# Patient Record
Sex: Male | Born: 1950 | Race: Black or African American | Marital: Single | Smoking: Never smoker
Health system: Southern US, Community
[De-identification: ages and names within clinical notes are randomized; demographics above are authoritative.]

## PROBLEM LIST (undated history)

## (undated) DIAGNOSIS — I10 Essential (primary) hypertension: Secondary | ICD-10-CM

---

## 2012-07-23 ENCOUNTER — Encounter (HOSPITAL_COMMUNITY): Payer: Self-pay | Admitting: *Deleted

## 2012-07-23 ENCOUNTER — Emergency Department (HOSPITAL_COMMUNITY): Payer: BC Managed Care – PPO

## 2012-07-23 ENCOUNTER — Emergency Department (HOSPITAL_COMMUNITY)
Admission: EM | Admit: 2012-07-23 | Discharge: 2012-07-23 | Disposition: A | Discharge status: Home / Self Care | Payer: BC Managed Care – PPO | Attending: Emergency Medicine | Admitting: Emergency Medicine

## 2012-07-23 DIAGNOSIS — R5381 Other malaise: Secondary | ICD-10-CM | POA: Insufficient documentation

## 2012-07-23 DIAGNOSIS — Z792 Long term (current) use of antibiotics: Secondary | ICD-10-CM | POA: Insufficient documentation

## 2012-07-23 DIAGNOSIS — J189 Pneumonia, unspecified organism: Secondary | ICD-10-CM

## 2012-07-23 DIAGNOSIS — R059 Cough, unspecified: Secondary | ICD-10-CM | POA: Insufficient documentation

## 2012-07-23 DIAGNOSIS — R509 Fever, unspecified: Secondary | ICD-10-CM | POA: Insufficient documentation

## 2012-07-23 DIAGNOSIS — I10 Essential (primary) hypertension: Secondary | ICD-10-CM | POA: Insufficient documentation

## 2012-07-23 DIAGNOSIS — R071 Chest pain on breathing: Secondary | ICD-10-CM | POA: Insufficient documentation

## 2012-07-23 DIAGNOSIS — R110 Nausea: Secondary | ICD-10-CM | POA: Insufficient documentation

## 2012-07-23 DIAGNOSIS — R05 Cough: Secondary | ICD-10-CM | POA: Insufficient documentation

## 2012-07-23 HISTORY — DX: Essential (primary) hypertension: I10

## 2012-07-23 LAB — CBC WITH DIFFERENTIAL/PLATELET
Basophils Absolute: 0 10*3/uL (ref 0.0–0.1)
Basophils Relative: 0 % (ref 0–1)
Eosinophils Absolute: 0 10*3/uL (ref 0.0–0.7)
Eosinophils Relative: 0 % (ref 0–5)
HCT: 44 % (ref 39.0–52.0)
Hemoglobin: 15.3 g/dL (ref 13.0–17.0)
Lymphocytes Relative: 10 % — ABNORMAL LOW (ref 12–46)
Lymphs Abs: 1.1 10*3/uL (ref 0.7–4.0)
MCH: 30.4 pg (ref 26.0–34.0)
MCHC: 34.8 g/dL (ref 30.0–36.0)
MCV: 87.3 fL (ref 78.0–100.0)
Monocytes Absolute: 1.6 10*3/uL — ABNORMAL HIGH (ref 0.1–1.0)
Monocytes Relative: 14 % — ABNORMAL HIGH (ref 3–12)
Neutro Abs: 8.7 10*3/uL — ABNORMAL HIGH (ref 1.7–7.7)
Neutrophils Relative %: 76 % (ref 43–77)
Platelets: 219 10*3/uL (ref 150–400)
RBC: 5.04 MIL/uL (ref 4.22–5.81)
RDW: 13.5 % (ref 11.5–15.5)
WBC: 11.5 10*3/uL — ABNORMAL HIGH (ref 4.0–10.5)

## 2012-07-23 LAB — COMPREHENSIVE METABOLIC PANEL
ALT: 19 U/L (ref 0–53)
AST: 26 U/L (ref 0–37)
Albumin: 3.6 g/dL (ref 3.5–5.2)
Alkaline Phosphatase: 64 U/L (ref 39–117)
BUN: 10 mg/dL (ref 6–23)
CO2: 27 mEq/L (ref 19–32)
Calcium: 8.8 mg/dL (ref 8.4–10.5)
Chloride: 99 mEq/L (ref 96–112)
Creatinine, Ser: 1.13 mg/dL (ref 0.50–1.35)
GFR calc Af Amer: 80 mL/min — ABNORMAL LOW (ref >90)
GFR calc non Af Amer: 69 mL/min — ABNORMAL LOW (ref >90)
Glucose, Bld: 113 mg/dL — ABNORMAL HIGH (ref 70–99)
Potassium: 3.3 mEq/L — ABNORMAL LOW (ref 3.5–5.1)
Sodium: 137 mEq/L (ref 135–145)
Total Bilirubin: 0.4 mg/dL (ref 0.3–1.2)
Total Protein: 7.5 g/dL (ref 6.0–8.3)

## 2012-07-23 MED ORDER — ACETAMINOPHEN 325 MG PO TABS
650.0000 mg | ORAL_TABLET | Freq: Once | ORAL | Status: AC
  Administered 2012-07-23: 650 mg via ORAL
  Filled 2012-07-23: qty 2

## 2012-07-23 MED ORDER — AZITHROMYCIN 250 MG PO TABS: 250.0000 mg | ORAL_TABLET | Freq: Every day | ORAL | Status: DC

## 2012-07-23 MED ORDER — IBUPROFEN 400 MG PO TABS
600.0000 mg | ORAL_TABLET | Freq: Once | ORAL | Status: DC
  Filled 2012-07-23: qty 1

## 2012-07-23 MED ORDER — DEXTROSE 5 % IV SOLN
500.0000 mg | Freq: Once | INTRAVENOUS | Status: AC
  Administered 2012-07-23: 500 mg via INTRAVENOUS
  Filled 2012-07-23: qty 500

## 2012-07-23 MED ORDER — POTASSIUM CHLORIDE CRYS ER 20 MEQ PO TBCR
20.0000 meq | EXTENDED_RELEASE_TABLET | Freq: Once | ORAL | Status: AC
  Administered 2012-07-23: 20 meq via ORAL
  Filled 2012-07-23: qty 1

## 2012-07-23 MED ORDER — DEXTROSE 5 % IV SOLN
1.0000 g | Freq: Once | INTRAVENOUS | Status: AC
  Administered 2012-07-23: 1 g via INTRAVENOUS
  Filled 2012-07-23: qty 10

## 2012-07-23 MED ORDER — IBUPROFEN 800 MG PO TABS
800.0000 mg | ORAL_TABLET | Freq: Once | ORAL | Status: AC
  Administered 2012-07-23: 800 mg via ORAL

## 2012-07-23 MED ORDER — SODIUM CHLORIDE 0.9 % IV BOLUS (SEPSIS)
1000.0000 mL | Freq: Once | INTRAVENOUS | Status: AC
  Administered 2012-07-23: 1000 mL via INTRAVENOUS

## 2012-07-23 NOTE — ED Provider Notes (Signed)
History    60 year old male with fevers and chills. Symptom onset approximately 3 days ago. Started out as generalized weakness and general malaise. Occasional cough which has become productive for the past 24 hours. Occasional sharp pleuritic CP. No shortness of breath. No unusual leg pain or swelling. Denies history of blood clot. Mild nausea but no vomiting.  Nonsmoker. Denies cardiac hx.   CSN: [id number]  Arrival date & time 07/23/12  1529   First MD Initiated Contact with Patient 07/23/12 2106      Chief Complaint  Patient presents with  . Chills    (Consider location/radiation/quality/duration/timing/severity/associated sxs/prior treatment) HPI  Past Medical History  Diagnosis Date  . Hypertension     History reviewed. No pertinent past surgical history.  History reviewed. No pertinent family history.  History  Substance Use Topics  . Smoking status: Never Smoker   . Smokeless tobacco: Not on file  . Alcohol Use: No      Review of Systems   Review of symptoms negative unless otherwise noted in HPI.   Allergies  Review of patient's allergies indicates no known allergies.  Home Medications   Current Outpatient Rx  Name  Route  Sig  Dispense  Refill  . AZITHROMYCIN 250 MG PO TABS   Oral   Take 1 tablet (250 mg total) by mouth daily.   6 tablet   0     BP 132/75  Pulse 95  Temp 100.4 F (38 C) (Oral)  Resp 16  SpO2 96%  Physical Exam  Nursing note and vitals reviewed. Constitutional: He appears well-developed and well-nourished. No distress.  HENT:  Head: Normocephalic and atraumatic.  Eyes: Conjunctivae normal are normal. Right eye exhibits no discharge. Left eye exhibits no discharge.  Neck: Neck supple.  Cardiovascular: Normal rate, regular rhythm and normal heart sounds.  Exam reveals no gallop and no friction rub.   No murmur heard. Pulmonary/Chest: Effort normal and breath sounds normal. No respiratory distress.       Coarse breath  sounds R side  Abdominal: Soft. He exhibits no distension. There is no tenderness.  Musculoskeletal: He exhibits no edema and no tenderness.       Lower extremities symmetric as compared to each other. No calf tenderness. Negative Homan's. No palpable cords.   Neurological: He is alert.  Skin: Skin is warm and dry.  Psychiatric: He has a normal mood and affect. His behavior is normal. Thought content normal.    ED Course  Procedures (including critical care time)  Labs Reviewed  CBC WITH DIFFERENTIAL - Abnormal; Notable for the following:    WBC 11.5 (*)     Neutro Abs 8.7 (*)     Lymphocytes Relative 10 (*)     Monocytes Relative 14 (*)     Monocytes Absolute 1.6 (*)     All other components within normal limits  COMPREHENSIVE METABOLIC PANEL - Abnormal; Notable for the following:    Potassium 3.3 (*)     Glucose, Bld 113 (*)     GFR calc non Af Amer 69 (*)     GFR calc Af Amer 80 (*)     All other components within normal limits   Dg Chest 2 View  07/23/2012  *RADIOLOGY REPORT*  Clinical Data: Fever.  Dizziness.  CHEST - 2 VIEW  Comparison: None.  Findings: Normal sized heart.  Clear lungs.  Right lower lobe airspace opacity.  Clear left lung.  Mild lower thoracic spine degenerative changes.  IMPRESSION:  Right lower lobe pneumonia.   Original Report Authenticated By: Beckie Salts, M.D.      1. Community acquired pneumonia       MDM  61 year old male with fever and chills. Chest x-ray with a right lower lobe pneumonia. Patient with no significant comorbidities. No respiratory distress on exam. I feel he is appropriate for outpatient therapy. Return precautions were discussed. Outpatient followup otherwise to        Raeford Razor, MD 07/23/12 2241

## 2012-07-23 NOTE — ED Notes (Signed)
Pt reports chills, slight cough and congestion x several days.  Pt reports being tired.  Denies pain.

## 2012-07-23 NOTE — ED Notes (Signed)
Resting quietly on stretcher with family at bedside; awaiting Zithromax infusion to complete prior to d/c to home - pt and family aware of same

## 2013-08-04 IMAGING — CR DG CHEST 2V
2 series · 2 of 2 positions shown · non-contrast
Comparison: None.

CLINICAL DATA: Fever.  Dizziness.

CHEST - 2 VIEW

[w chest pa]
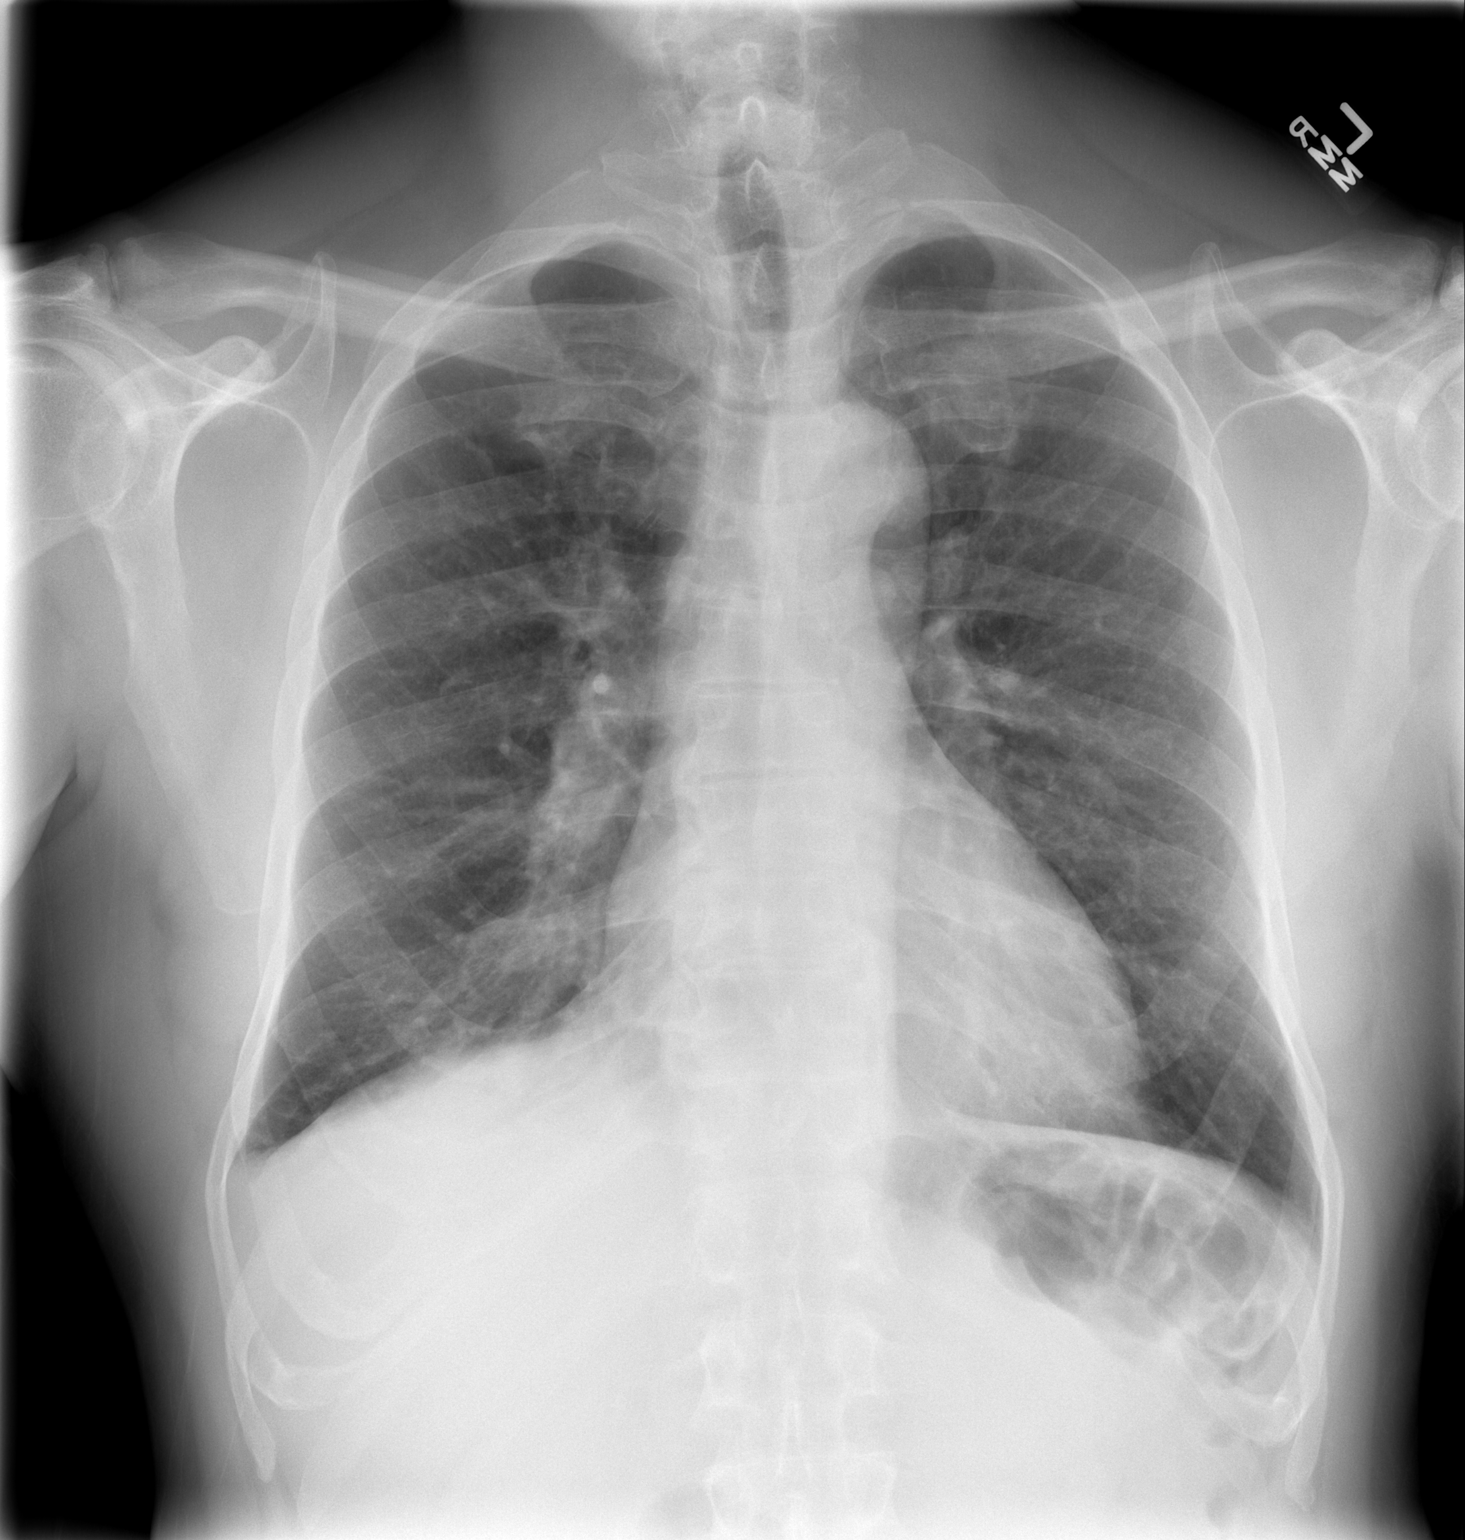

[w chest lat]
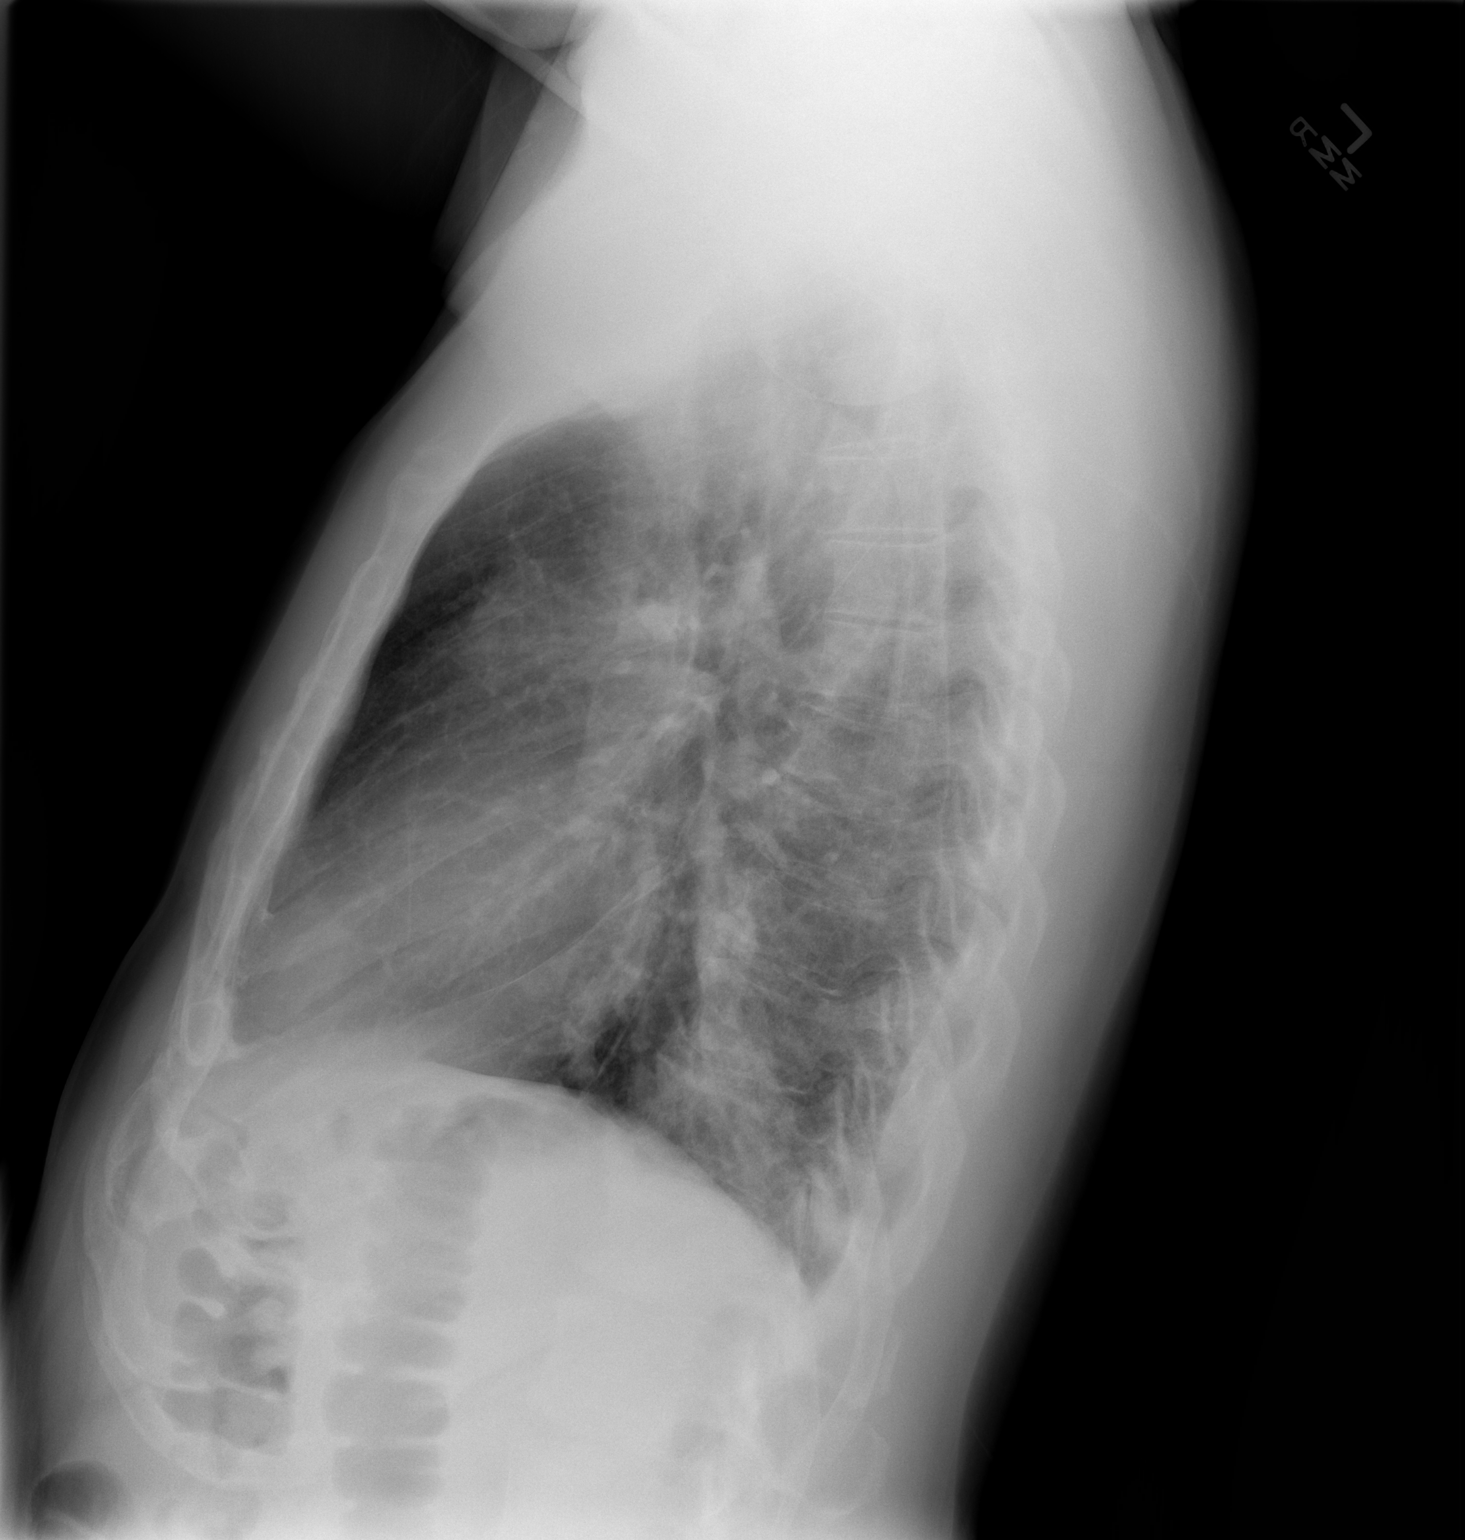

[2 of 2 positions shown; findings below may reference images not displayed]

FINDINGS: Normal sized heart.  Clear lungs.  Right lower lobe
airspace opacity.  Clear left lung.  Mild lower thoracic spine
degenerative changes.
IMPRESSION: Right lower lobe pneumonia.

## 2015-09-21 ENCOUNTER — Encounter (HOSPITAL_COMMUNITY): Payer: Self-pay

## 2015-09-21 ENCOUNTER — Emergency Department (HOSPITAL_COMMUNITY): Payer: Managed Care, Other (non HMO)

## 2015-09-21 ENCOUNTER — Inpatient Hospital Stay (HOSPITAL_COMMUNITY)
Admission: EM | Admit: 2015-09-21 | Discharge: 2015-09-23 | DRG: 872 | Disposition: A | Discharge status: Home / Self Care | Payer: Managed Care, Other (non HMO) | Attending: Internal Medicine | Admitting: Internal Medicine

## 2015-09-21 DIAGNOSIS — B9789 Other viral agents as the cause of diseases classified elsewhere: Secondary | ICD-10-CM | POA: Diagnosis present

## 2015-09-21 DIAGNOSIS — J390 Retropharyngeal and parapharyngeal abscess: Secondary | ICD-10-CM | POA: Diagnosis present

## 2015-09-21 DIAGNOSIS — I10 Essential (primary) hypertension: Secondary | ICD-10-CM | POA: Diagnosis present

## 2015-09-21 DIAGNOSIS — Z8249 Family history of ischemic heart disease and other diseases of the circulatory system: Secondary | ICD-10-CM

## 2015-09-21 DIAGNOSIS — L0390 Cellulitis, unspecified: Secondary | ICD-10-CM | POA: Diagnosis present

## 2015-09-21 DIAGNOSIS — R739 Hyperglycemia, unspecified: Secondary | ICD-10-CM | POA: Diagnosis present

## 2015-09-21 DIAGNOSIS — J039 Acute tonsillitis, unspecified: Secondary | ICD-10-CM | POA: Diagnosis present

## 2015-09-21 DIAGNOSIS — T380X5A Adverse effect of glucocorticoids and synthetic analogues, initial encounter: Secondary | ICD-10-CM | POA: Diagnosis present

## 2015-09-21 DIAGNOSIS — K219 Gastro-esophageal reflux disease without esophagitis: Secondary | ICD-10-CM | POA: Diagnosis present

## 2015-09-21 DIAGNOSIS — A419 Sepsis, unspecified organism: Secondary | ICD-10-CM | POA: Diagnosis present

## 2015-09-21 LAB — CBC WITH DIFFERENTIAL/PLATELET
BASOS PCT: 0 %
Basophils Absolute: 0 10*3/uL (ref 0.0–0.1)
EOS ABS: 0.1 10*3/uL (ref 0.0–0.7)
EOS PCT: 0 %
HCT: 47.5 % (ref 39.0–52.0)
HEMOGLOBIN: 16.5 g/dL (ref 13.0–17.0)
LYMPHS ABS: 1.8 10*3/uL (ref 0.7–4.0)
Lymphocytes Relative: 13 %
MCH: 30.7 pg (ref 26.0–34.0)
MCHC: 34.7 g/dL (ref 30.0–36.0)
MCV: 88.3 fL (ref 78.0–100.0)
MONO ABS: 2 10*3/uL — AB (ref 0.1–1.0)
MONOS PCT: 14 %
NEUTROS PCT: 73 %
Neutro Abs: 10.2 10*3/uL — ABNORMAL HIGH (ref 1.7–7.7)
PLATELETS: 261 10*3/uL (ref 150–400)
RBC: 5.38 MIL/uL (ref 4.22–5.81)
RDW: 13.7 % (ref 11.5–15.5)
WBC: 14.1 10*3/uL — ABNORMAL HIGH (ref 4.0–10.5)

## 2015-09-21 LAB — BASIC METABOLIC PANEL WITH GFR
Anion gap: 12 (ref 5–15)
BUN: 10 mg/dL (ref 6–20)
CO2: 27 mmol/L (ref 22–32)
Calcium: 9.4 mg/dL (ref 8.9–10.3)
Chloride: 100 mmol/L — ABNORMAL LOW (ref 101–111)
Creatinine, Ser: 1.2 mg/dL (ref 0.61–1.24)
GFR calc Af Amer: >60 mL/min
GFR calc non Af Amer: >60 mL/min
Glucose, Bld: 107 mg/dL — ABNORMAL HIGH (ref 65–99)
Potassium: 3.9 mmol/L (ref 3.5–5.1)
Sodium: 139 mmol/L (ref 135–145)

## 2015-09-21 LAB — TYPE AND SCREEN
ABO/RH(D): B POS
Antibody Screen: NEGATIVE

## 2015-09-21 LAB — I-STAT CHEM 8, ED
BUN: 13 mg/dL (ref 6–20)
Calcium, Ion: 1.08 mmol/L — ABNORMAL LOW (ref 1.13–1.30)
Chloride: 98 mmol/L — ABNORMAL LOW (ref 101–111)
Creatinine, Ser: 1 mg/dL (ref 0.61–1.24)
Glucose, Bld: 105 mg/dL — ABNORMAL HIGH (ref 65–99)
HCT: 52 % (ref 39.0–52.0)
Hemoglobin: 17.7 g/dL — ABNORMAL HIGH (ref 13.0–17.0)
Potassium: 3.6 mmol/L (ref 3.5–5.1)
Sodium: 138 mmol/L (ref 135–145)
TCO2: 29 mmol/L (ref 0–100)

## 2015-09-21 LAB — GLUCOSE, CAPILLARY
GLUCOSE-CAPILLARY: 105 mg/dL — AB (ref 65–99)
Glucose-Capillary: 127 mg/dL — ABNORMAL HIGH (ref 65–99)
Glucose-Capillary: 115 mg/dL — ABNORMAL HIGH (ref 65–99)

## 2015-09-21 LAB — CBG MONITORING, ED: GLUCOSE-CAPILLARY: 92 mg/dL (ref 65–99)

## 2015-09-21 LAB — APTT: aPTT: 29 seconds (ref 24–37)

## 2015-09-21 LAB — PROCALCITONIN

## 2015-09-21 LAB — MRSA PCR SCREENING: MRSA by PCR: NEGATIVE

## 2015-09-21 LAB — ABO/RH: ABO/RH(D): B POS

## 2015-09-21 LAB — PROTIME-INR
INR: 1.13 (ref 0.00–1.49)
Prothrombin Time: 14.6 seconds (ref 11.6–15.2)

## 2015-09-21 LAB — RAPID STREP SCREEN (MED CTR MEBANE ONLY): Streptococcus, Group A Screen (Direct): NEGATIVE

## 2015-09-21 MED ORDER — LIDOCAINE VISCOUS 2 % MT SOLN
15.0000 mL | Freq: Once | OROMUCOSAL | Status: AC
  Administered 2015-09-21: 15 mL via OROMUCOSAL
  Filled 2015-09-21: qty 15

## 2015-09-21 MED ORDER — DEXAMETHASONE SODIUM PHOSPHATE 4 MG/ML IJ SOLN
4.0000 mg | Freq: Four times a day (QID) | INTRAMUSCULAR | Status: DC
Start: 2015-09-21 — End: 2015-09-21
  Administered 2015-09-21: 4 mg via INTRAVENOUS
  Filled 2015-09-21: qty 1

## 2015-09-21 MED ORDER — METHYLPREDNISOLONE SODIUM SUCC 125 MG IJ SOLR
125.0000 mg | Freq: Once | INTRAMUSCULAR | Status: AC
  Administered 2015-09-21: 125 mg via INTRAVENOUS
  Filled 2015-09-21: qty 2

## 2015-09-21 MED ORDER — ENOXAPARIN SODIUM 40 MG/0.4ML ~~LOC~~ SOLN
40.0000 mg | SUBCUTANEOUS | Status: DC
  Administered 2015-09-21 – 2015-09-23 (×3): 40 mg via SUBCUTANEOUS
  Filled 2015-09-21 (×3): qty 0.4

## 2015-09-21 MED ORDER — INFLUENZA VAC SPLIT QUAD 0.5 ML IM SUSY
0.5000 mL | PREFILLED_SYRINGE | INTRAMUSCULAR | Status: AC
  Administered 2015-09-22: 0.5 mL via INTRAMUSCULAR
  Filled 2015-09-21: qty 0.5

## 2015-09-21 MED ORDER — SODIUM CHLORIDE 0.9 % IV SOLN: 250.0000 mL | INTRAVENOUS | Status: DC | PRN

## 2015-09-21 MED ORDER — DEXAMETHASONE SODIUM PHOSPHATE 4 MG/ML IJ SOLN
4.0000 mg | Freq: Two times a day (BID) | INTRAMUSCULAR | Status: DC
  Administered 2015-09-21 – 2015-09-23 (×4): 4 mg via INTRAVENOUS
  Filled 2015-09-21 (×5): qty 1

## 2015-09-21 MED ORDER — SODIUM CHLORIDE 0.9 % IV SOLN
3.0000 g | Freq: Four times a day (QID) | INTRAVENOUS | Status: DC
  Administered 2015-09-21 – 2015-09-23 (×8): 3 g via INTRAVENOUS
  Filled 2015-09-21 (×14): qty 3

## 2015-09-21 MED ORDER — CLINDAMYCIN PHOSPHATE 900 MG/50ML IV SOLN: 900.0000 mg | Freq: Three times a day (TID) | INTRAVENOUS | Status: DC

## 2015-09-21 MED ORDER — LACTATED RINGERS IV SOLN
INTRAVENOUS | Status: DC
  Administered 2015-09-21: 11:00:00 via INTRAVENOUS

## 2015-09-21 MED ORDER — INSULIN ASPART 100 UNIT/ML ~~LOC~~ SOLN: 0.0000 [IU] | SUBCUTANEOUS | Status: DC

## 2015-09-21 MED ORDER — HYDRALAZINE HCL 20 MG/ML IJ SOLN: 10.0000 mg | INTRAMUSCULAR | Status: DC | PRN

## 2015-09-21 MED ORDER — IOHEXOL 300 MG/ML  SOLN
75.0000 mL | Freq: Once | INTRAMUSCULAR | Status: AC | PRN
Start: 2015-09-21 — End: 2015-09-21
  Administered 2015-09-21: 75 mL via INTRAVENOUS

## 2015-09-21 MED ORDER — CLINDAMYCIN PHOSPHATE 900 MG/50ML IV SOLN
900.0000 mg | Freq: Once | INTRAVENOUS | Status: AC
  Administered 2015-09-21: 900 mg via INTRAVENOUS
  Filled 2015-09-21: qty 50

## 2015-09-21 MED ORDER — AMPICILLIN-SULBACTAM SODIUM 3 (2-1) G IJ SOLR
3.0000 g | Freq: Once | INTRAMUSCULAR | Status: AC
Start: 2015-09-21 — End: 2015-09-21
  Administered 2015-09-21: 3 g via INTRAVENOUS
  Filled 2015-09-21: qty 3

## 2015-09-21 MED ORDER — WHITE PETROLATUM GEL
Status: AC
  Administered 2015-09-21: 0.2
  Filled 2015-09-21: qty 1

## 2015-09-21 MED ORDER — PANTOPRAZOLE SODIUM 40 MG IV SOLR
40.0000 mg | Freq: Every day | INTRAVENOUS | Status: DC
  Administered 2015-09-21 – 2015-09-22 (×2): 40 mg via INTRAVENOUS
  Filled 2015-09-21 (×3): qty 40

## 2015-09-21 MED ORDER — CLINDAMYCIN PHOSPHATE 600 MG/50ML IV SOLN: 600.0000 mg | Freq: Four times a day (QID) | INTRAVENOUS | Status: DC

## 2015-09-21 NOTE — ED Notes (Signed)
ENT at bedside

## 2015-09-21 NOTE — ED Provider Notes (Addendum)
CSN: [id number]     Arrival date & time 09/21/15  0448 History   First MD Initiated Contact with Patient 09/21/15 [phone]     Chief Complaint  Patient presents with  . Sore Throat     (Consider location/radiation/quality/duration/timing/severity/associated sxs/prior Treatment) HPI Comments: Presents to the ER for evaluation of sore throat and difficulty swallowing. Patient reports several days of progressively worsening throat swelling and difficulty swallowing. He reports that he feels that he has a lot of sputum in the back of his throat and cannot swallow it. He has not had any fever. There is no difficulty breathing.  Patient is a 65 y.o. male presenting with pharyngitis.  Sore Throat    Past Medical History  Diagnosis Date  . Hypertension    History reviewed. No pertinent past surgical history. History reviewed. No pertinent family history. Social History  Substance Use Topics  . Smoking status: Never Smoker   . Smokeless tobacco: None  . Alcohol Use: No    Review of Systems  HENT: Positive for sore throat and trouble swallowing.   All other systems reviewed and are negative.     Allergies  Review of patient's allergies indicates no known allergies.  Home Medications   Prior to Admission medications   Medication Sig Start Date End Date Taking? Authorizing Provider  menthol-cetylpyridinium (CEPACOL) 3 MG lozenge Take 1 lozenge by mouth as needed for sore throat.   Yes Historical Provider, MD   BP 160/104 mmHg  Pulse 74  Temp(Src) 98.1 F (36.7 C) (Oral)  Resp 13  Ht  (1.727 m)  Wt 190 lb (86.183 kg)  BMI 28.90 kg/m2  SpO2 93% Physical Exam  Constitutional: He is oriented to person, place, and time. He appears well-developed and well-nourished. No distress.  HENT:  Head: Normocephalic and atraumatic.  Right Ear: Hearing normal.  Left Ear: Hearing normal.  Nose: Nose normal.  Mouth/Throat: Mucous membranes are normal. There is trismus in the jaw.  Uvula swelling present. Posterior oropharyngeal edema and posterior oropharyngeal erythema present.  Diffuse posterior oropharyngeal soft tissue swelling with predominant swelling on the right side and some shift across midline from right to left  No stridor  Eyes: Conjunctivae and EOM are normal. Pupils are equal, round, and reactive to light.  Neck: Normal range of motion. Neck supple.  Cardiovascular: Regular rhythm, S1 normal and S2 normal.  Exam reveals no gallop and no friction rub.   No murmur heard. Pulmonary/Chest: Effort normal and breath sounds normal. No respiratory distress. He exhibits no tenderness.  Abdominal: Soft. Normal appearance and bowel sounds are normal. There is no hepatosplenomegaly. There is no tenderness. There is no rebound, no guarding, no tenderness at McBurney's point and negative Murphy's sign. No hernia.  Musculoskeletal: Normal range of motion.  Neurological: He is alert and oriented to person, place, and time. He has normal strength. No cranial nerve deficit or sensory deficit. Coordination normal. GCS eye subscore is 4. GCS verbal subscore is 5. GCS motor subscore is 6.  Skin: Skin is warm, dry and intact. No rash noted. No cyanosis.  Psychiatric: He has a normal mood and affect. His speech is normal and behavior is normal. Thought content normal.  Nursing note and vitals reviewed.   ED Course  Procedures (including critical care time) Labs Review Labs Reviewed  CBC WITH DIFFERENTIAL/PLATELET - Abnormal; Notable for the following:    WBC 14.1 (*)    Neutro Abs 10.2 (*)    Monocytes Absolute 2.0 (*)  All other components within normal limits  BASIC METABOLIC PANEL - Abnormal; Notable for the following:    Chloride 100 (*)    Glucose, Bld 107 (*)    All other components within normal limits  I-STAT CHEM 8, ED - Abnormal; Notable for the following:    Chloride 98 (*)    Glucose, Bld 105 (*)    Calcium, Ion 1.08 (*)    Hemoglobin 17.7 (*)    All  other components within normal limits  RAPID STREP SCREEN (NOT AT [facility])  CULTURE, GROUP A STREP [facility])    Imaging Review Ct Soft Tissue Neck W Contrast  09/21/2015  CLINICAL DATA:  65 year old hypertensive male with throat swelling and difficulty swallowing. Initial encounter. EXAM: CT NECK WITH CONTRAST TECHNIQUE: Multidetector CT imaging of the neck was performed using the standard protocol following the bolus administration of intravenous contrast. CONTRAST:  75mL OMNIPAQUE IOHEXOL 300 MG/ML  SOLN COMPARISON:  None. FINDINGS: Pharynx and larynx: Diffuse inflammation of the right palatine tonsil with extension of inflammatory process from the level of the skullbase to the glottic region. 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 Inflammatory process extends to surround and partially involved right aryepiglottic fold, right aspect of the epiglottis and right aspect the soft palate. Extension of inflammatory process into the right parapharyngeal space, right carotid space and pre vertebral/retropharyngeal region. Fluid surrounds the right common carotid artery and right internal carotid artery which are of slightly smaller caliber than the left suggestion there may be mild spasm from inflammation. Currently, no well-defined deep drainable abscess in the right parapharyngeal space, right carotid space or prevertebral/parapharyngeal space. Salivary glands: No primary abnormality noted. Inflammatory process borders the right submandibular gland. Thyroid: No primary abnormality. Inflammatory process borders the right lobe of thyroid gland. Lymph nodes: Increase number of normal size and enlarged lymph nodes with adenopathy largest in the level 2 region bilaterally greater on the right with maximal transverse dimension of 2.6 x 1.5 cm. Vascular: Narrowing of the right carotid artery as detailed above. Right internal jugular vein remains patent. Limited intracranial: Negative. Visualized  orbits: Limited imaging unremarkable. Mastoids and visualized paranasal sinuses: Limited imaging unremarkable. 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 Upper chest: Negative. IMPRESSION: Diffuse inflammation of the right palatine tonsil with extension of inflammatory process from the level of the skullbase to the glottic region. 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 Inflammatory process involves right aryepiglottic fold, right aspect of the epiglottis and right aspect the soft palate. Extension of inflammatory process into the right parapharyngeal space, right carotid space and pre vertebral/retropharyngeal region. Fluid surrounds the right common carotid artery and right internal carotid artery which are of slightly smaller caliber than the left suggestion there may be mild spasm from inflammation. Currently, no well-defined deep drainable abscess in the right parapharyngeal space, right carotid space or prevertebral/parapharyngeal space. Adenopathy level 2 region greater on the right. Increased number of normal size lymph nodes bilaterally greater on the right. 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 These results were called by telephone at the time of interpretation on 09/21/2015 at 6:39 am to Dr. Jaci Carrel , who verbally acknowledged these results. Electronically Signed   By: Lacy Duverney M.D.   On: 09/21/2015 07:05   I have personally reviewed and evaluated these images and lab results as part of my medical decision-making.  EKG Interpretation None      MDM   Final diagnoses:  None   peritonsillar inflammation  Patient presents to the ER for evaluation of progressively worsening sore throat and throat swelling. Examination reveals significant posterior oropharyngeal edema with predominance on the right side. CT scan with  concerning findings of significant inflammatory process from the right palatine tonsil through the glottic region. Vocal cords are involved. There is also inflammation and fluid surrounding the right common carotid artery and right internal carotid artery.  Discussed with Dr. Lazarus Salines, with patient, is in OR. Will see patient.  Discussed with Dr. Jamison Neighbor, critical care, will see patient in the ER.  CRITICAL CARE Performed by: Gilda Crease   Total critical care time: 30 minutes  Critical care time was exclusive of separately billable procedures and treating other patients.  Critical care was necessary to treat or prevent imminent or life-threatening deterioration.  Critical care was time spent personally by me on the following activities: development of treatment plan with patient and/or surrogate as well as nursing, discussions with consultants, evaluation of patient's response to treatment, examination of patient, obtaining history from patient or surrogate, ordering and performing treatments and interventions, ordering and review of laboratory studies, ordering and review of radiographic studies, pulse oximetry and re-evaluation of patient's condition. c      Gilda Crease, MD 09/21/15 [id number]  Gilda Crease, MD 09/21/15 [phone]

## 2015-09-21 NOTE — Progress Notes (Signed)
ANTIBIOTIC CONSULT NOTE - INITIAL  Pharmacy Consult for clindamycin, amp-sulbactam Indication: retropharyngeal cellulitis   No Known Allergies  Patient Measurements: Height:  (172.7 cm) Weight: 190 lb (86.183 kg) IBW/kg (Calculated) : 68.4  Vital Signs: Temp: 98.1 F (36.7 C) (01/30 0504) Temp Source: Oral (01/30 0504) BP: 166/95 mmHg (01/30 0730) Pulse Rate: 87 (01/30 0730) Intake/Output from previous day:   Intake/Output from this shift:    Labs:  Recent Labs  09/21/15 0530 09/21/15 0540  WBC 14.1*  --   HGB 16.5 17.7*  PLT 261  --   CREATININE 1.20 1.00   Estimated Creatinine Clearance: 81.8 mL/min (by C-G formula based on Cr of 1). No results for input(s): VANCOTROUGH, VANCOPEAK, VANCORANDOM, GENTTROUGH, GENTPEAK, GENTRANDOM, TOBRATROUGH, TOBRAPEAK, TOBRARND, AMIKACINPEAK, AMIKACINTROU, AMIKACIN in the last 72 hours.   Assessment: 65 yo male presenting with sore throat and difficulty swallowing - this has been progressive  PMH: HTN  ID: abx for retropharyngeal cellulitis. WBC 14.1, AF  Clindamycin 1/30>> Amp-Sulbactam 1/30>>  Rapid Steg Neg Group A Strep Culture: Pending Blood x 2  Renal: SCr 1  Plan:  Clindamycin 900 mg IV q8h Unasyn 3 gm IV q6h Monitor cultures, renal fx, LOT for abx  Isaac Bliss, PharmD, BCPS, [facility] Clinical Pharmacist Pager [phone] 09/21/2015 8:51 AM

## 2015-09-21 NOTE — ED Notes (Signed)
[phone] Nestor MD with critical care wants to be paged with any changes on pt.  Per Tomasa Hose MD pt is to remain in ED until evaluated by ENT.

## 2015-09-21 NOTE — ED Notes (Signed)
ENT cart at bedside

## 2015-09-21 NOTE — Consult Note (Signed)
Parker Hansen 65 y.o., male [id number]     Chief Complaint: sore, swollen throat  HPI: 65 year old black male comes in for evaluation of throat swelling. He developed a mild sore throat roughly 7 days ago. This has been gradually progressive. Some pain. Possible fever, unmeasured, last evening. Swallowing has been poor for the last 24 hours. No prior similar history. He has hypertension but is not being treated. Specifically, no ACE inhibitors.  No history of strep throat or tonsillitis. Known immune compromise including prednisone or HIV. He does not smoke nor drink. No foreign body ingestion or trauma to the neck or throat.  Evaluation in the emergency room shows white blood cell count 14 with a left shift. Strep screen is negative. A CT scan of the neck shows swelling of the right tonsil, parapharyngeal tissues, including some edema tracking along the carotid sheath. No apparent jugular vein thrombosis. Several small level II right neck nodes. There is swelling of the right lateral and posterior pharyngeal wall and apparent complete obstruction of the vocal cord level. The area epiglottic fold looks slightly swollen. 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    PMH: Past Medical History  Diagnosis Date  . Hypertension     Surg TD:VVOHYWV reviewed. No pertinent past surgical history.  FHx:  History reviewed. No pertinent family history. SocHx:  reports that he has never smoked. He does not have any smokeless tobacco history on file. He reports that he does not drink alcohol or use illicit drugs.  ALLERGIES: No Known Allergies   (Not in a hospital admission)  Results for orders placed or performed during the hospital encounter of 09/21/15 (from the past 48 hour(s))  CBC with Differential/Platelet     Status: Abnormal   Collection Time: 09/21/15  5:30 AM  Result Value Ref Range   WBC 14.1 (H) 4.0 - 10.5 K/uL   RBC 5.38 4.22 - 5.81 MIL/uL   Hemoglobin 16.5 13.0 - 17.0 g/dL   HCT  47.5 39.0 - 52.0 %   MCV 88.3 78.0 - 100.0 fL   MCH 30.7 26.0 - 34.0 pg   MCHC 34.7 30.0 - 36.0 g/dL   RDW 13.7 11.5 - 15.5 %   Platelets 261 150 - 400 K/uL   Neutrophils Relative % 73 %   Neutro Abs 10.2 (H) 1.7 - 7.7 K/uL   Lymphocytes Relative 13 %   Lymphs Abs 1.8 0.7 - 4.0 K/uL   Monocytes Relative 14 %   Monocytes Absolute 2.0 (H) 0.1 - 1.0 K/uL   Eosinophils Relative 0 %   Eosinophils Absolute 0.1 0.0 - 0.7 K/uL   Basophils Relative 0 %   Basophils Absolute 0.0 0.0 - 0.1 K/uL  Basic metabolic panel     Status: Abnormal   Collection Time: 09/21/15  5:30 AM  Result Value Ref Range   Sodium 139 135 - 145 mmol/L   Potassium 3.9 3.5 - 5.1 mmol/L   Chloride 100 (L) 101 - 111 mmol/L   CO2 27 22 - 32 mmol/L   Glucose, Bld 107 (H) 65 - 99 mg/dL   BUN 10 6 - 20 mg/dL   Creatinine, Ser 1.20 0.61 - 1.24 mg/dL   Calcium 9.4 8.9 - 10.3 mg/dL   GFR calc non Af Amer >60 >60 mL/min   GFR calc Af Amer >60 >60 mL/min    Comment: (NOTE) The eGFR has been calculated using the CKD EPI equation. This calculation has not been validated in all clinical situations. eGFR's persistently <  60 mL/min signify possible Chronic Kidney Disease.    Anion gap 12 5 - 15  Rapid strep screen     Status: None   Collection Time: 09/21/15  5:34 AM  Result Value Ref Range   Streptococcus, Group A Screen (Direct) NEGATIVE NEGATIVE    Comment: (NOTE) A Rapid Antigen test may result negative if the antigen level in the sample is below the detection level of this test. The FDA has not cleared this test as a stand-alone test therefore the rapid antigen negative result has reflexed to a Group A Strep culture.   I-stat chem 8, ed     Status: Abnormal   Collection Time: 09/21/15  5:40 AM  Result Value Ref Range   Sodium 138 135 - 145 mmol/L   Potassium 3.6 3.5 - 5.1 mmol/L   Chloride 98 (L) 101 - 111 mmol/L   BUN 13 6 - 20 mg/dL   Creatinine, Ser 1.00 0.61 - 1.24 mg/dL   Glucose, Bld 105 (H) 65 - 99 mg/dL    Calcium, Ion 1.08 (L) 1.13 - 1.30 mmol/L   TCO2 29 0 - 100 mmol/L   Hemoglobin 17.7 (H) 13.0 - 17.0 g/dL   HCT 52.0 39.0 - 52.0 %  Type and screen [place]     Status: None   Collection Time: 09/21/15  8:45 AM  Result Value Ref Range   ABO/RH(D) B POS    Antibody Screen NEG    Sample Expiration 09/24/2015   Protime-INR     Status: None   Collection Time: 09/21/15  8:45 AM  Result Value Ref Range   Prothrombin Time 14.6 11.6 - 15.2 seconds   INR 1.13 0.00 - 1.49  APTT     Status: None   Collection Time: 09/21/15  8:45 AM  Result Value Ref Range   aPTT 29 24 - 37 seconds  CBG monitoring, ED     Status: None   Collection Time: 09/21/15  9:19 AM  Result Value Ref Range   Glucose-Capillary 92 65 - 99 mg/dL   Comment 1 Document in Chart    Ct Soft Tissue Neck W Contrast  09/21/2015  CLINICAL DATA:  65 year old hypertensive male with throat swelling and difficulty swallowing. Initial encounter. EXAM: CT NECK WITH CONTRAST TECHNIQUE: Multidetector CT imaging of the neck was performed using the standard protocol following the bolus administration of intravenous contrast. CONTRAST:  65m OMNIPAQUE IOHEXOL 300 MG/ML  SOLN COMPARISON:  None. FINDINGS: Pharynx and larynx: Diffuse inflammation of the right palatine tonsil with extension of inflammatory process from the level of the skullbase to the glottic region. 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 Inflammatory process extends to surround and partially involved right aryepiglottic fold, right aspect of the epiglottis and right aspect the soft palate. Extension of inflammatory process into the right parapharyngeal space, right carotid space and pre vertebral/retropharyngeal region. Fluid surrounds the right common carotid artery and right internal carotid artery which are of slightly smaller caliber than the left suggestion there may be mild spasm from inflammation. Currently, no well-defined deep  drainable abscess in the right parapharyngeal space, right carotid space or prevertebral/parapharyngeal space. Salivary glands: No primary abnormality noted. Inflammatory process borders the right submandibular gland. Thyroid: No primary abnormality. Inflammatory process borders the right lobe of thyroid gland. Lymph nodes: Increase number of normal size and enlarged lymph nodes with adenopathy largest in the level 2 region bilaterally greater on the right with maximal transverse dimension  of 2.6 x 1.5 cm. Vascular: Narrowing of the right carotid artery as detailed above. Right internal jugular vein remains patent. Limited intracranial: Negative. Visualized orbits: Limited imaging unremarkable. Mastoids and visualized paranasal sinuses: Limited imaging unremarkable. 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 Upper chest: Negative. IMPRESSION: Diffuse inflammation of the right palatine tonsil with extension of inflammatory process from the level of the skullbase to the glottic region. 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 Inflammatory process involves right aryepiglottic fold, right aspect of the epiglottis and right aspect the soft palate. Extension of inflammatory process into the right parapharyngeal space, right carotid space and pre vertebral/retropharyngeal region. Fluid surrounds the right common carotid artery and right internal carotid artery which are of slightly smaller caliber than the left suggestion there may be mild spasm from inflammation. Currently, no well-defined deep drainable abscess in the right parapharyngeal space, right carotid space or prevertebral/parapharyngeal space. Adenopathy level 2 region greater on the right. Increased number of normal size lymph nodes bilaterally greater on the right. 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  These results were called by telephone at the time of interpretation on 09/21/2015 at 6:39 am to Dr. Joseph Berkshire , who verbally acknowledged these results. Electronically Signed   By: Genia Del M.D.   On: 09/21/2015 07:05    ROS: No chest pain or shortness of breath. No pain with neck motion.  Blood pressure 145/91, pulse 79, temperature 98.1 F (36.7 C), temperature source Oral, resp. rate 13, height _0  (1.727 m), weight 86.183 kg (190 lb), SpO2 95 %.  PHYSICAL EXAM: Overall appearance:  He is lying semireclining in the bed looking fairly comfortable. No stridor. He clears his throat occasionally. Voice is clear. Head:  NCAT Ears:  Clear Nose: Mild rightward septal deviation Oral Cavity: Moist with teeth in good repair. Oral Pharynx/Hypopharynx/Larynx:  The right soft palate is red but not particularly swollen. The uvula is displaced towards the left but not edematous. Right tonsil is 3+ with exudate. Left tonsil is 1-2 plus. He is mildly tender at the right peritonsillar area at the mid pole of the tonsil.  I could not adequately see hypopharynx/larynx directly. Neuro: Grossly intact Neck: Nonswollen, nontender  With informed consent, using 5 mL's of 2% topical Xylocaine gel for local anesthesia, the flexible laryngoscope was introduced through the left nose. There is swelling of the superior pole of the right tonsil at the soft palate level. Swelling of the right lateral and posterior pharyngeal wall. There is slight swelling of the right aryepiglottic fold. 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 Vocal cords are fully mobile and without swelling. Airway is good. The piriform sinus is eventrated on the right side.  Studies Reviewed:  CT neck (see above).  I reviewed this with Dr. Chauncey Cruel, radiologist.    Assessment/Plan Right tonsillitis with edema/progression to include the posterior and lateral pharyngeal tissues, edema along the upper carotid on the right, and right  hypopharyngeal and supraglottic laryngeal swelling. No clear abscess. Airway is adequate.  Discussed with Dr. Ashok Cordia with critical care medicine.  He will admit him to a stepdown unit. Will cover with Decadron and Unasyn. Could advance diet today, but keep nothing by mouth after midnight in anticipation of possible surgical drainage. I discussed this with patient.  Jodi Marble 3/90/3009, 9:41 AM

## 2015-09-21 NOTE — ED Notes (Signed)
Lidocaine at bedside for ENT

## 2015-09-21 NOTE — ED Provider Notes (Signed)
8:35 AM resting comfortably in bed, handling secretions well. Appears in no distress  Doug Sou, MD 09/21/15 1758

## 2015-09-21 NOTE — H&P (Addendum)
PULMONARY / CRITICAL CARE MEDICINE   Name: Parker Hansen MRN: [id number] DOB: 05-Nov-1950    ADMISSION DATE:  09/21/2015 CONSULTATION DATE:  09/21/2015  REFERRING MD:  Jaci Carrel, M.D. - EDP  CHIEF COMPLAINT:  Sepsis with Tonsillitis   HISTORY OF PRESENT ILLNESS:   65 year old male with known past medical history of hypertension not currently on any medications. Patient reports that for approximately 1 week he's had a sore throat that progressively worsened. He reports now he is having trouble swallowing saliva and has noticed voice changes. Denies any neck pain or swelling. Denies any dyspnea or cough. Denies any chest pain or pressure. He denies any subjective chills or sweats but did experience a fever last night. Denies any headache or vision changes. Patient has not taken any antibiotics recently.  PAST MEDICAL HISTORY :  Past Medical History  Diagnosis Date  . Hypertension     PAST SURGICAL HISTORY: No prior surgeries.  No Known Allergies   HOME MEDS:  None  FAMILY HISTORY:  Family History  Problem Relation Age of Onset  . Hypertension      SOCIAL HISTORY: Social History   Social History  . Marital Status: Single    Spouse Name: N/A  . Number of Children: N/A  . Years of Education: N/A   Social History Main Topics  . Smoking status: Never Smoker   . Smokeless tobacco: None  . Alcohol Use: No  . Drug Use: No  . Sexual Activity: Not Asked   Other Topics Concern  . None   Social History Narrative   Works Teacher, music.    REVIEW OF SYSTEMS:  No rashes or abnormal bruising. No lymphadenopathy in his neck, groin, or axilla. A pertinent 14 point review of systems is negative except as per the history of presenting illness.  SUBJECTIVE:   VITAL SIGNS: BP 145/91 mmHg  Pulse 79  Temp(Src) 97.6 F (36.4 C) (Oral)  Resp 13  Ht  (1.727 m)  Wt 188 lb 4.4 oz (85.4 kg)  BMI 28.63 kg/m2  SpO2 95%  HEMODYNAMICS:    VENTILATOR  SETTINGS:    INTAKE / OUTPUT:    PHYSICAL EXAMINATION: General:  Awake. Alert. No acute distress. Laying in bed.  Integument:  Warm & dry. No rash on exposed skin. No bruising. Lymphatics:  No appreciated cervical or supraclavicular lymphadenoapthy. HEENT:  Moist mucus membranes. Right tonsil as well as uvula swollen and obstructing proximally 50% or more of visible retropharynx. No evidence of pain with palpation of mouth floor. Cardiovascular:  Regular rate. No edema. No appreciable JVD.  Pulmonary:  Good aeration & clear to auscultation bilaterally. Symmetric chest wall expansion. No accessory muscle use. Abdomen: Soft. Normal bowel sounds. Nondistended. Grossly nontender. Musculoskeletal:  Normal bulk and tone. Hand grip strength 5/5 bilaterally. No joint deformity or effusion appreciated. Neurological:  CN 2-12 grossly in tact. No meningismus. Moving all 4 extremities equally. Symmetric brachioradialis deep tendon reflexes. Psychiatric:  Mood and affect congruent. Speech normal rhythm, rate & tone.   LABS:  BMET  Recent Labs Lab 09/21/15 0530 09/21/15 0540  NA 139 138  K 3.9 3.6  CL 100* 98*  CO2 27  --   BUN 10 13  CREATININE 1.20 1.00  GLUCOSE 107* 105*    Electrolytes  Recent Labs Lab 09/21/15 0530  CALCIUM 9.4    CBC  Recent Labs Lab 09/21/15 0530 09/21/15 0540  WBC 14.1*  --   HGB 16.5 17.7*  HCT 47.5  52.0  PLT 261  --     Coag's  Recent Labs Lab 09/21/15 0845  APTT 29  INR 1.13    Sepsis Markers No results for input(s): LATICACIDVEN, PROCALCITON, O2SATVEN in the last 168 hours.  ABG No results for input(s): PHART, PCO2ART, PO2ART in the last 168 hours.  Liver Enzymes No results for input(s): AST, ALT, ALKPHOS, BILITOT, ALBUMIN in the last 168 hours.  Cardiac Enzymes No results for input(s): TROPONINI, PROBNP in the last 168 hours.  Glucose  Recent Labs Lab 09/21/15 0919 09/21/15 0955  GLUCAP 92 105*    Imaging Ct Soft  Tissue Neck W Contrast  09/21/2015  CLINICAL DATA:  65 year old hypertensive male with throat swelling and difficulty swallowing. Initial encounter. EXAM: CT NECK WITH CONTRAST TECHNIQUE: Multidetector CT imaging of the neck was performed using the standard protocol following the bolus administration of intravenous contrast. CONTRAST:  75mL OMNIPAQUE IOHEXOL 300 MG/ML  SOLN COMPARISON:  None. FINDINGS: Pharynx and larynx: Diffuse inflammation of the right palatine tonsil with extension of inflammatory process from the level of the skullbase to the glottic region. 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 Inflammatory process extends to surround and partially involved right aryepiglottic fold, right aspect of the epiglottis and right aspect the soft palate. Extension of inflammatory process into the right parapharyngeal space, right carotid space and pre vertebral/retropharyngeal region. Fluid surrounds the right common carotid artery and right internal carotid artery which are of slightly smaller caliber than the left suggestion there may be mild spasm from inflammation. Currently, no well-defined deep drainable abscess in the right parapharyngeal space, right carotid space or prevertebral/parapharyngeal space. Salivary glands: No primary abnormality noted. Inflammatory process borders the right submandibular gland. Thyroid: No primary abnormality. Inflammatory process borders the right lobe of thyroid gland. Lymph nodes: Increase number of normal size and enlarged lymph nodes with adenopathy largest in the level 2 region bilaterally greater on the right with maximal transverse dimension of 2.6 x 1.5 cm. Vascular: Narrowing of the right carotid artery as detailed above. Right internal jugular vein remains patent. Limited intracranial: Negative. Visualized orbits: Limited imaging unremarkable. Mastoids and visualized paranasal sinuses: Limited imaging unremarkable. Skeleton: Cervical spondylotic  changes with spinal stenosis and cord flattening most prominent C5-6 and C6-7. Upper chest: Negative. IMPRESSION: Diffuse inflammation of the right palatine tonsil with extension of inflammatory process from the level of the skullbase to the glottic region. 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 Inflammatory process involves right aryepiglottic fold, right aspect of the epiglottis and right aspect the soft palate. Extension of inflammatory process into the right parapharyngeal space, right carotid space and pre vertebral/retropharyngeal region. Fluid surrounds the right common carotid artery and right internal carotid artery which are of slightly smaller caliber than the left suggestion there may be mild spasm from inflammation. Currently, no well-defined deep drainable abscess in the right parapharyngeal space, right carotid space or prevertebral/parapharyngeal space. Adenopathy level 2 region greater on the right. Increased number of normal size lymph nodes bilaterally greater on the right. 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 These results were called by telephone at the time of interpretation on 09/21/2015 at 6:39 am to Dr. Jaci Carrel , who verbally acknowledged these results. Electronically Signed   By: Lacy Duverney M.D.   On: 09/21/2015 07:05     STUDIES:  CT Neck/Soft Tissue 1/30: Inflammation right palatine tonsil with extension to the level  of the skull base and glottic region. Apposition of vocal cords with obliteration of airway. Inflammatory process involving right aryepiglottic fold, right aspect of epiglottis, & right aspect of soft palate. Right carotid space and prevertebral retropharyngeal region also with inflammatory changes. Fluids running right common carotid and internal carotid with findings suggesting mild vasospasm from inflammation. Right-sided lymphadenopathy  noted.  CULTURES: Rapid Strep 1/30:  Negative Blood Cultures x2 1/30:  Pending  ANTIBIOTICS: Unasyn 1/30>> Clindamycin 1/30 (stopped)  SIGNIFICANT EVENTS: 1/30 - Admit to SDU  LINES/TUBES: PIV X1  ASSESSMENT / PLAN:  65 year old African male with past medical history of hypertension presenting with a sore throat and evidence of sepsis from right tonsillitis with cellulitis extending to the right internal and common carotid arteries. No drainable abscess on review of CT imaging. I had a lengthy discussion with Dr. Blinda Leatherwood as well as Dr. Lazarus Salines after he examined the patient with direct laryngoscopy. Patient's exam findings are remarkably better than findings on CT imaging. At this time he recommended admission to stepdown unit for further continued monitoring as treatment continues with steroids and antibiotics.  1. Sepsis: Secondary to tonsillitis/cellulitis. Trending Procalcitonin per algorithm & leukocytosis with daily CBC. 2. Right tonsillitis/cellulitis: Continuing patient on Unasyn & Decadron IV every 12 hours. ENT following. Plan for repeat examination in the morning. 3. Hypertension: Not currently on any outpatient medications. Vitals per unit protocol. Hydralazine IV when necessary. 4. Prophylaxis: SCDs, Lovenox subcutaneous daily, & Protonix IV daily. 5. Diet: Clear liquid diet as tolerated. Patient will be nothing by mouth after midnight pending examination in the morning. 6. CODE STATUS: Patient is full code per my discussion with him at bedside today.  Hospitalist service will assume care and PCCM will sign off as of 1/31.  Donna Christen Jamison Hansen, M.D. [facility] Pulmonary & Critical Care Pager:  [phone] After 3pm or if no response, call [phone] 09/21/2015, 10:44 AM

## 2015-09-21 NOTE — ED Notes (Signed)
Pt states that this past week he noticed his throat hurting and it has progressively got harder to swallow as the week as gone one.

## 2015-09-22 LAB — BASIC METABOLIC PANEL
ANION GAP: 11 (ref 5–15)
BUN: 14 mg/dL (ref 6–20)
CALCIUM: 9.4 mg/dL (ref 8.9–10.3)
CO2: 26 mmol/L (ref 22–32)
Chloride: 101 mmol/L (ref 101–111)
Creatinine, Ser: 0.95 mg/dL (ref 0.61–1.24)
GFR calc Af Amer: >60 mL/min (ref >60)
GLUCOSE: 115 mg/dL — AB (ref 65–99)
Potassium: 3.7 mmol/L (ref 3.5–5.1)
SODIUM: 138 mmol/L (ref 135–145)

## 2015-09-22 LAB — PHOSPHORUS: Phosphorus: 4.2 mg/dL (ref 2.5–4.6)

## 2015-09-22 LAB — GLUCOSE, CAPILLARY
GLUCOSE-CAPILLARY: 111 mg/dL — AB (ref 65–99)
Glucose-Capillary: 100 mg/dL — ABNORMAL HIGH (ref 65–99)
Glucose-Capillary: 100 mg/dL — ABNORMAL HIGH (ref 65–99)
Glucose-Capillary: 102 mg/dL — ABNORMAL HIGH (ref 65–99)
Glucose-Capillary: 113 mg/dL — ABNORMAL HIGH (ref 65–99)
Glucose-Capillary: 115 mg/dL — ABNORMAL HIGH (ref 65–99)

## 2015-09-22 LAB — CBC
HCT: 45.5 % (ref 39.0–52.0)
Hemoglobin: 15.7 g/dL (ref 13.0–17.0)
MCH: 30.5 pg (ref 26.0–34.0)
MCHC: 34.5 g/dL (ref 30.0–36.0)
MCV: 88.3 fL (ref 78.0–100.0)
PLATELETS: 273 10*3/uL (ref 150–400)
RBC: 5.15 MIL/uL (ref 4.22–5.81)
RDW: 13.4 % (ref 11.5–15.5)
WBC: 17.8 10*3/uL — AB (ref 4.0–10.5)

## 2015-09-22 LAB — MAGNESIUM: MAGNESIUM: 2.3 mg/dL (ref 1.7–2.4)

## 2015-09-22 LAB — PROCALCITONIN

## 2015-09-22 MED ORDER — INSULIN ASPART 100 UNIT/ML ~~LOC~~ SOLN
0.0000 [IU] | Freq: Three times a day (TID) | SUBCUTANEOUS | Status: DC
Start: 2015-09-22 — End: 2015-09-23

## 2015-09-22 MED ORDER — AMLODIPINE BESYLATE 5 MG PO TABS
5.0000 mg | ORAL_TABLET | Freq: Every day | ORAL | Status: DC
  Administered 2015-09-22 – 2015-09-23 (×2): 5 mg via ORAL
  Filled 2015-09-22 (×2): qty 1

## 2015-09-22 NOTE — Progress Notes (Signed)
Pt a/o, denies pain. Transfer order in place. Report given to upcoming nurse. Pt's belongings, medications, education papers with patient. Pt transported to [id number].

## 2015-09-22 NOTE — Progress Notes (Signed)
[facility] PROGRESS NOTE  Parker Hansen ZOX:[id number] DOB: 08/10/1951 DOA: 09/21/2015 PCP: No primary care provider on file.  HPI/Subjective: The patient feels much improved today, he is able to swallow without pain. He denies any shortness of breath.  Assessment/Plan: Sepsis secondary to tonsillitis: CT with diffuse right tonsil inflammation but no evidence of abscess. Procalcitonin negative. WBC elevated, suspect increase due to steroids, no fevers. Rapid strep negative, follow blood cultures. Airway stable, oxygenating well on room air, no stridor or wheezing; speech clear and no dysphagia. Continue Decadron and IV Unasyn for now. Trial of full liquids, slowly advance diet today as tolerated. Can transition to PO Augmentin for discharge. ENT input appreciated, no need for surgical intervention.  Hyperglycemia, likely steroid induced, await a1c, novolog ordered if needed. Monitor CBGs.  Hypertension: add low dose amlodipine-continue PRN hydralazine.  Code Status: FULL Family Communication: None at bedside  Disposition Plan: Likely discharge home in 1-2 days if clinically improving and tolerating diet. DVT ppx: Lovenox   Consultants:  ENT  Procedures:  None  Antibiotics:  Unasyn started 1/30    Objective: Filed Vitals:   09/22/15 1000 09/22/15 1100  BP: 143/88 150/91  Pulse: 72 72  Temp:    Resp: 11 14    Intake/Output Summary (Last 24 hours) at 09/22/15 1129 Last data filed at 09/22/15 1000  Gross per 24 hour  Intake   1740 ml  Output    700 ml  Net   1040 ml   Filed Weights   09/21/15 0504 09/21/15 1000 09/22/15 0500  Weight: 86.183 kg (190 lb) 85.4 kg (188 lb 4.4 oz) 86.7 kg (191 lb 2.2 oz)    Exam:   General:  Alert, NAD  Cardiovascular: RRR  Respiratory: non labored, no stridor or wheezing  Abdomen: soft, nontender  Musculoskeletal: moves all 4 extremities  Data Reviewed: Basic Metabolic Panel:  Recent Labs Lab 09/21/15 0530  09/21/15 0540 09/22/15 0223  NA 139 138 138  K 3.9 3.6 3.7  CL 100* 98* 101  CO2 27  --  26  GLUCOSE 107* 105* 115*  BUN CREATININE 1.20 1.00 0.95  CALCIUM 9.4  --  9.4  MG  --   --  2.3  PHOS  --   --  4.2   Liver Function Tests: No results for input(s): AST, ALT, ALKPHOS, BILITOT, PROT, ALBUMIN in the last 168 hours. No results for input(s): LIPASE, AMYLASE in the last 168 hours. No results for input(s): AMMONIA in the last 168 hours. CBC:  Recent Labs Lab 09/21/15 0530 09/21/15 0540 09/22/15 0223  WBC 14.1*  --  17.8*  NEUTROABS 10.2*  --   --   HGB 16.5 17.7* 15.7  HCT 47.5 52.0 45.5  MCV 88.3  --  88.3  PLT 261  --  273   Cardiac Enzymes: No results for input(s): CKTOTAL, CKMB, CKMBINDEX, TROPONINI in the last 168 hours. BNP (last 3 results) No results for input(s): BNP in the last 8760 hours.  ProBNP (last 3 results) No results for input(s): PROBNP in the last 8760 hours.  CBG:  Recent Labs Lab 09/21/15 1553 09/21/15 1953 09/22/15 0008 09/22/15 0338 09/22/15 0815  GLUCAP 127* 115* 115* 100* 111*    Recent Results (from the past 240 hour(s))  Rapid strep screen     Status: None   Collection Time: 09/21/15  5:34 AM  Result Value Ref Range Status   Streptococcus, Group A Screen (Direct) NEGATIVE NEGATIVE Final  Comment: (NOTE) A Rapid Antigen test may result negative if the antigen level in the sample is below the detection level of this test. The FDA has not cleared this test as a stand-alone test therefore the rapid antigen negative result has reflexed to a Group A Strep culture.   Culture, group A strep     Status: None (Preliminary result)   Collection Time: 09/21/15  5:34 AM  Result Value Ref Range Status   Specimen Description THROAT  Final   Special Requests NONE Reflexed from [id number]  Final   Culture CULTURE REINCUBATED FOR BETTER GROWTH  Final   Report Status PENDING  Incomplete  MRSA PCR Screening     Status: None    Collection Time: 09/21/15 10:00 AM  Result Value Ref Range Status   MRSA by PCR NEGATIVE NEGATIVE Final    Comment:        The GeneXpert MRSA Assay (FDA approved for NASAL specimens only), is one component of a comprehensive MRSA colonization surveillance program. It is not intended to diagnose MRSA infection nor to guide or monitor treatment for MRSA infections.      Studies: Ct Soft Tissue Neck W Contrast  09/21/2015  CLINICAL DATA:  65 year old hypertensive male with throat swelling and difficulty swallowing. Initial encounter. EXAM: CT NECK WITH CONTRAST TECHNIQUE: Multidetector CT imaging of the neck was performed using the standard protocol following the bolus administration of intravenous contrast. CONTRAST:  75mL OMNIPAQUE IOHEXOL 300 MG/ML  SOLN COMPARISON:  None. FINDINGS: Pharynx and larynx: Diffuse inflammation of the right palatine tonsil with extension of inflammatory process from the level of the skullbase to the glottic region. 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 Inflammatory process extends to surround and partially involved right aryepiglottic fold, right aspect of the epiglottis and right aspect the soft palate. Extension of inflammatory process into the right parapharyngeal space, right carotid space and pre vertebral/retropharyngeal region. Fluid surrounds the right common carotid artery and right internal carotid artery which are of slightly smaller caliber than the left suggestion there may be mild spasm from inflammation. Currently, no well-defined deep drainable abscess in the right parapharyngeal space, right carotid space or prevertebral/parapharyngeal space. Salivary glands: No primary abnormality noted. Inflammatory process borders the right submandibular gland. Thyroid: No primary abnormality. Inflammatory process borders the right lobe of thyroid gland. Lymph nodes: Increase number of normal size and enlarged lymph nodes with adenopathy  largest in the level 2 region bilaterally greater on the right with maximal transverse dimension of 2.6 x 1.5 cm. Vascular: Narrowing of the right carotid artery as detailed above. Right internal jugular vein remains patent. Limited intracranial: Negative. Visualized orbits: Limited imaging unremarkable. Mastoids and visualized paranasal sinuses: Limited imaging unremarkable. 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 Upper chest: Negative. IMPRESSION: Diffuse inflammation of the right palatine tonsil with extension of inflammatory process from the level of the skullbase to the glottic region. 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 Inflammatory process involves right aryepiglottic fold, right aspect of the epiglottis and right aspect the soft palate. Extension of inflammatory process into the right parapharyngeal space, right carotid space and pre vertebral/retropharyngeal region. Fluid surrounds the right common carotid artery and right internal carotid artery which are of slightly smaller caliber than the left suggestion there may be mild spasm from inflammation. Currently, no well-defined deep drainable abscess in the right parapharyngeal space, right carotid space or prevertebral/parapharyngeal space. Adenopathy  level 2 region greater on the right. Increased number of normal size lymph nodes bilaterally greater on the right. 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 These results were called by telephone at the time of interpretation on 09/21/2015 at 6:39 am to Dr. Jaci Carrel , who verbally acknowledged these results. Electronically Signed   By: Lacy Duverney M.D.   On: 09/21/2015 07:05    Scheduled Meds: . ampicillin-sulbactam (UNASYN) IV  3 g Intravenous Q6H  . dexamethasone  4 mg Intravenous Q12H  . enoxaparin (LOVENOX) injection  40 mg  Subcutaneous Q24H  . insulin aspart  0-9 Units Subcutaneous TID WC  . pantoprazole (PROTONIX) IV  40 mg Intravenous QHS   Continuous Infusions:   Principal Problem:   Sepsis (HCC) Active Problems:   Hypertension   Time spent: 25 minutes  Paige Horcher PA-S [facility]  If 7PM-7AM, please contact night-coverage at [email or web address], password [facility] 09/22/2015, 11:29 AM  LOS: 1 day    Attending MD note  Patient was seen, examined,treatment plan was discussed with the PA-S.  I have personally reviewed the clinical findings, lab, imaging studies and management of this patient in detail. I agree with the documentation, as recorded by the PA-S.   Admitted with sepsis due to pharyngitis/tonisllitis with edema-much improved with IV Steroids and IV Unasyn. Sepsis pathophysiology has resolved. Blood cultures negative. Benign exam-no stridor. Transfer to Med Surg-advance diet, if clinical improvement continues-home tomorrow.Rest as above.   [facility]

## 2015-09-22 NOTE — Progress Notes (Signed)
09/22/2015 9:25 AM  Kathlene Cote [id number]  Hosp Day 2    Temp:  [97.3 F (36.3 C)-98 F (36.7 C)] 97.8 F (36.6 C) (01/31 0818) Pulse Rate:  [69-93] 69 (01/31 0800) Resp:  [12-25] 13 (01/31 0800) BP: (127-168)/(58-103) 147/89 mmHg (01/31 0800) SpO2:  [94 %-98 %] 97 % (01/31 0800) Weight:  [85.4 kg (188 lb 4.4 oz)-86.7 kg (191 lb 2.2 oz)] 86.7 kg (191 lb 2.2 oz) (01/31 0500),     Intake/Output Summary (Last 24 hours) at 09/22/15 0925 Last data filed at 09/22/15 0800  Gross per 24 hour  Intake   1675 ml  Output    975 ml  Net    700 ml    Results for orders placed or performed during the hospital encounter of 09/21/15 (from the past 24 hour(s))  Glucose, capillary     Status: Abnormal   Collection Time: 09/21/15  9:55 AM  Result Value Ref Range   Glucose-Capillary 105 (H) 65 - 99 mg/dL  MRSA PCR Screening     Status: None   Collection Time: 09/21/15 10:00 AM  Result Value Ref Range   MRSA by PCR NEGATIVE NEGATIVE  Glucose, capillary     Status: Abnormal   Collection Time: 09/21/15  3:53 PM  Result Value Ref Range   Glucose-Capillary 127 (H) 65 - 99 mg/dL  Glucose, capillary     Status: Abnormal   Collection Time: 09/21/15  7:53 PM  Result Value Ref Range   Glucose-Capillary 115 (H) 65 - 99 mg/dL  Glucose, capillary     Status: Abnormal   Collection Time: 09/22/15 12:08 AM  Result Value Ref Range   Glucose-Capillary 115 (H) 65 - 99 mg/dL  CBC     Status: Abnormal   Collection Time: 09/22/15  2:23 AM  Result Value Ref Range   WBC 17.8 (H) 4.0 - 10.5 K/uL   RBC 5.15 4.22 - 5.81 MIL/uL   Hemoglobin 15.7 13.0 - 17.0 g/dL   HCT 78.2 95.6 - 21.3 %   MCV 88.3 78.0 - 100.0 fL   MCH 30.5 26.0 - 34.0 pg   MCHC 34.5 30.0 - 36.0 g/dL   RDW 08.6 57.8 - 46.9 %   Platelets 273 150 - 400 K/uL  Basic metabolic panel     Status: Abnormal   Collection Time: 09/22/15  2:23 AM  Result Value Ref Range   Sodium 138 135 - 145 mmol/L   Potassium 3.7 3.5 - 5.1 mmol/L   Chloride  101 101 - 111 mmol/L   CO2 26 22 - 32 mmol/L   Glucose, Bld 115 (H) 65 - 99 mg/dL   BUN 14 6 - 20 mg/dL   Creatinine, Ser 6.29 0.61 - 1.24 mg/dL   Calcium 9.4 8.9 - 52.8 mg/dL   GFR calc non Af Amer >60 >60 mL/min   GFR calc Af Amer >60 >60 mL/min   Anion gap 11 5 - 15  Magnesium     Status: None   Collection Time: 09/22/15  2:23 AM  Result Value Ref Range   Magnesium 2.3 1.7 - 2.4 mg/dL  Phosphorus     Status: None   Collection Time: 09/22/15  2:23 AM  Result Value Ref Range   Phosphorus 4.2 2.5 - 4.6 mg/dL  Procalcitonin     Status: None   Collection Time: 09/22/15  2:23 AM  Result Value Ref Range   Procalcitonin <0.10 ng/mL  Glucose, capillary     Status: Abnormal  Collection Time: 09/22/15  3:38 AM  Result Value Ref Range   Glucose-Capillary 100 (H) 65 - 99 mg/dL  Glucose, capillary     Status: Abnormal   Collection Time: 09/22/15  8:15 AM  Result Value Ref Range   Glucose-Capillary 111 (H) 65 - 99 mg/dL    SUBJECTIVE:  Sl less throat pain.  Able to swallow small amounts.  OBJECTIVE:  Voice cl.  Airway good. Neck soft.  RIGHT tonsil swollen, but no palatal edema or bulging  IMPRESSION:  Improved.  WBC still elevated  PLAN:  Advance diet and activity.  Out of ICU.  Could switch to po Augmentin liquid and discharge home when adequate po .  Recheck my office 2 weeks, sooner as needed.  Flo Shanks

## 2015-09-23 LAB — HEMOGLOBIN A1C
HEMOGLOBIN A1C: 5.5 % (ref 4.8–5.6)
Mean Plasma Glucose: 111 mg/dL

## 2015-09-23 LAB — CULTURE, GROUP A STREP (THRC)

## 2015-09-23 LAB — GLUCOSE, CAPILLARY
Glucose-Capillary: 103 mg/dL — ABNORMAL HIGH (ref 65–99)
Glucose-Capillary: 99 mg/dL (ref 65–99)

## 2015-09-23 MED ORDER — AMLODIPINE BESYLATE 5 MG PO TABS: 5.0000 mg | ORAL_TABLET | Freq: Every day | ORAL | Status: DC

## 2015-09-23 MED ORDER — PANTOPRAZOLE SODIUM 40 MG PO TBEC: 40.0000 mg | DELAYED_RELEASE_TABLET | Freq: Every day | ORAL | Status: DC

## 2015-09-23 MED ORDER — AMOXICILLIN-POT CLAVULANATE 875-125 MG PO TABS: 1.0000 | ORAL_TABLET | Freq: Two times a day (BID) | ORAL | Status: DC

## 2015-09-23 NOTE — Discharge Summary (Addendum)
Physician Discharge Summary  Parker Hansen AVW:[id number] DOB: [birth year] DOA: 09/21/2015  PCP: No primary care provider on file.  Admit date: 09/21/2015 Discharge date: 09/23/2015  Time spent: 25 minutes  Recommendations for Outpatient Follow-up:  1. Follow-up with a Primary Care Physician as outpatient in 1-2 weeks to assess resolution of tonsilitis and for further HTN management. 2. Follow-up with ENT Dr. Lazarus Salines in 1-2 weeks.   Discharge Diagnoses:  Principal Problem:   Sepsis Parker Hansen) Active Problems:   Hypertension   Discharge Condition: Stable  Diet recommendation: General  Filed Weights   09/21/15 1000 09/22/15 0500 09/23/15 0700  Weight: 85.4 kg (188 lb 4.4 oz) 86.7 kg (191 lb 2.2 oz) 86.365 kg (190 lb 6.4 oz)    History of present illness:  Parker Hansen is a 65 year old male with past medical history of hypertension not currently on any medications, who presented with worsening sore throat x1 week with trouble swallowing saliva.  Hansen Course:  Sepsis secondary to tonsillitis: CT with diffuse right tonsil inflammation but no evidence of abscess. Procalcitonin negative. Rapid strep negative, blood cultures negative to date. Remains afebrile, airway stable, oxygenating well on room air, no stridor or wheezing; speech clear. Tolerating regular diet without major issues. Treated with Decadron and IV Unasyn. Transition to PO Augmentin x12 more days upon discharge. Follow-up with ENT in 1-2 weeks.  Hyperglycemia, mild, likely steroid induced, a1c 5.5. No treatment needed at this point  Hypertension: Started on amlodipine, continue as outpatient.   Procedures:  None   Consultations:  ENT  Discharge Exam: Filed Vitals:   09/22/15 2124 09/23/15 1151  BP: 132/81 140/72  Pulse: 60 72  Temp: 97.8 F (36.6 C)   Resp: 20     General: Alert, NAD, speech clear HEENT: neck supple with no mass  Cardiovascular: RRR Respiratory: non labored respirations  Discharge  Instructions   Discharge Instructions    Activity as tolerated - No restrictions    Complete by:  As directed      Call MD for:  difficulty breathing, headache or visual disturbances    Complete by:  As directed      Call MD for:  temperature >100.4    Complete by:  As directed      Diet general    Complete by:  As directed      Other Restrictions    Complete by:  As directed   May return to work on Monday 09/28/2015          Current Discharge Medication List    START taking these medications   Details  amLODipine (NORVASC) 5 MG tablet Take 1 tablet (5 mg total) by mouth daily. Qty: 30 tablet, Refills: 0    amoxicillin-clavulanate (AUGMENTIN) 875-125 MG tablet Take 1 tablet by mouth 2 (two) times daily. Qty: 24 tablet, Refills: 0    pantoprazole (PROTONIX) 40 MG tablet Take 1 tablet (40 mg total) by mouth at bedtime. Qty: 30 tablet, Refills: 0      STOP taking these medications     menthol-cetylpyridinium (CEPACOL) 3 MG lozenge        No Known Allergies Follow-up Information    Follow up with Flo Shanks, MD. Schedule an appointment as soon as possible for a visit in 1 week.   Specialty:  Otolaryngology   Why:  [facility] Follow-up   Contact information:   [address] [place] [address] [phone]       Please follow up.  Why:  Hansen follow up   Contact information:   Primary MD of your choice in 1 week       The results of significant diagnostics from this hospitalization (including imaging, microbiology, ancillary and laboratory) are listed below for reference.    Significant Diagnostic Studies: Ct Soft Tissue Neck W Contrast  09/21/2015  CLINICAL DATA:  65 year old hypertensive male with throat swelling and difficulty swallowing. Initial encounter. EXAM: CT NECK WITH CONTRAST TECHNIQUE: Multidetector CT imaging of the neck was performed using the standard protocol following the bolus administration of intravenous contrast.  CONTRAST:  75mL OMNIPAQUE IOHEXOL 300 MG/ML  SOLN COMPARISON:  None. FINDINGS: Pharynx and larynx: Diffuse inflammation of the right palatine tonsil with extension of inflammatory process from the level of the skullbase to the glottic region. 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 Inflammatory process extends to surround and partially involved right aryepiglottic fold, right aspect of the epiglottis and right aspect the soft palate. Extension of inflammatory process into the right parapharyngeal space, right carotid space and pre vertebral/retropharyngeal region. Fluid surrounds the right common carotid artery and right internal carotid artery which are of slightly smaller caliber than the left suggestion there may be mild spasm from inflammation. Currently, no well-defined deep drainable abscess in the right parapharyngeal space, right carotid space or prevertebral/parapharyngeal space. Salivary glands: No primary abnormality noted. Inflammatory process borders the right submandibular gland. Thyroid: No primary abnormality. Inflammatory process borders the right lobe of thyroid gland. Lymph nodes: Increase number of normal size and enlarged lymph nodes with adenopathy largest in the level 2 region bilaterally greater on the right with maximal transverse dimension of 2.6 x 1.5 cm. Vascular: Narrowing of the right carotid artery as detailed above. Right internal jugular vein remains patent. Limited intracranial: Negative. Visualized orbits: Limited imaging unremarkable. Mastoids and visualized paranasal sinuses: Limited imaging unremarkable. 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 Upper chest: Negative. IMPRESSION: Diffuse inflammation of the right palatine tonsil with extension of inflammatory process from the level of the skullbase to the glottic region. 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  Inflammatory process involves right aryepiglottic fold, right aspect of the epiglottis and right aspect the soft palate. Extension of inflammatory process into the right parapharyngeal space, right carotid space and pre vertebral/retropharyngeal region. Fluid surrounds the right common carotid artery and right internal carotid artery which are of slightly smaller caliber than the left suggestion there may be mild spasm from inflammation. Currently, no well-defined deep drainable abscess in the right parapharyngeal space, right carotid space or prevertebral/parapharyngeal space. Adenopathy level 2 region greater on the right. Increased number of normal size lymph nodes bilaterally greater on the right. 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 These results were called by telephone at the time of interpretation on 09/21/2015 at 6:39 am to Dr. Jaci Carrel , who verbally acknowledged these results. Electronically Signed   By: Lacy Duverney M.D.   On: 09/21/2015 07:05    Microbiology: Recent Results (from the past 240 hour(s))  Rapid strep screen     Status: None   Collection Time: 09/21/15  5:34 AM  Result Value Ref Range Status   Streptococcus, Group A Screen (Direct) NEGATIVE NEGATIVE Final    Comment: (NOTE) A Rapid Antigen test may result negative if the antigen level in the sample is below the detection level of this test.  The FDA has not cleared this test as a stand-alone test therefore the rapid antigen negative result has reflexed to a Group A Strep culture.   Culture, group A strep     Status: None   Collection Time: 09/21/15  5:34 AM  Result Value Ref Range Status   Specimen Description THROAT  Final   Special Requests NONE Reflexed from [id number]  Final   Culture NO GROUP A STREP (S.PYOGENES) ISOLATED  Final   Report Status 09/23/2015 FINAL  Final  Culture, blood (routine x 2)     Status: None (Preliminary result)    Collection Time: 09/21/15  8:39 AM  Result Value Ref Range Status   Specimen Description BLOOD RIGHT HAND  Final   Special Requests BOTTLES DRAWN AEROBIC AND ANAEROBIC  Final   Culture NO GROWTH 2 DAYS  Final   Report Status PENDING  Incomplete  Culture, blood (routine x 2)     Status: None (Preliminary result)   Collection Time: 09/21/15  8:45 AM  Result Value Ref Range Status   Specimen Description BLOOD RIGHT FOREARM  Final   Special Requests BOTTLES DRAWN AEROBIC AND ANAEROBIC 5CCS  Final   Culture NO GROWTH 2 DAYS  Final   Report Status PENDING  Incomplete  MRSA PCR Screening     Status: None   Collection Time: 09/21/15 10:00 AM  Result Value Ref Range Status   MRSA by PCR NEGATIVE NEGATIVE Final    Comment:        The GeneXpert MRSA Assay (FDA approved for NASAL specimens only), is one component of a comprehensive MRSA colonization surveillance program. It is not intended to diagnose MRSA infection nor to guide or monitor treatment for MRSA infections.      Labs: Basic Metabolic Panel:  Recent Labs Lab 09/21/15 0530 09/21/15 0540 09/22/15 0223  NA 139 138 138  K 3.9 3.6 3.7  CL 100* 98* 101  CO2 27  --  26  GLUCOSE 107* 105* 115*  BUN 10 13 14   CREATININE 1.20 1.00 0.95  CALCIUM 9.4  --  9.4  MG  --   --  2.3  PHOS  --   --  4.2   Liver Function Tests: No results for input(s): AST, ALT, ALKPHOS, BILITOT, PROT, ALBUMIN in the last 168 hours. No results for input(s): LIPASE, AMYLASE in the last 168 hours. No results for input(s): AMMONIA in the last 168 hours. CBC:  Recent Labs Lab 09/21/15 0530 09/21/15 0540 09/22/15 0223  WBC 14.1*  --  17.8*  NEUTROABS 10.2*  --   --   HGB 16.5 17.7* 15.7  HCT 47.5 52.0 45.5  MCV 88.3  --  88.3  PLT 261  --  273   Cardiac Enzymes: No results for input(s): CKTOTAL, CKMB, CKMBINDEX, TROPONINI in the last 168 hours. BNP: BNP (last 3 results) No results for input(s): BNP in the last 8760 hours.  ProBNP  (last 3 results) No results for input(s): PROBNP in the last 8760 hours.  CBG:  Recent Labs Lab 09/22/15 1133 09/22/15 1640 09/22/15 2133 09/23/15 0820 09/23/15 1201  GLUCAP 113* 102* 100* 103* 99       Signed: Windell Norfolk MD  [facility] 09/23/2015, 1:15 PM

## 2015-09-23 NOTE — Progress Notes (Signed)
Pharmacist Provided - Patient Medication Education Prior to Discharge   Parker Hansen is an 65 y.o. male who presented to [facility] on 09/21/2015 with a chief complaint of  Chief Complaint  Patient presents with  . Sore Throat      Patient will be discharged with 2 new medications  Patient being discharged without any new medications  The following medications were discussed with the patient: amlodipine and augmentin  Pain Control medications:  Yes     No  Diabetes Medications:  Yes     No  Heart Failure Medications:  Yes     No  Anticoagulation Medications:   Yes     No  Antibiotics at discharge:  Yes     No  Allergy Assessment Completed and Updated:  Yes     No Identified Patient Allergies: No Known Allergies   Medication Adherence Assessment:  Excellent (no doses missed/week)       Good (1 dose missed/week)       Partial (2-3 doses missed/week)       Poor (>3 doses missed/week)  Barriers to Obtaining Medications:  Yes  No   Assessment: Counseled pt on dosing and potential side effects of amlodipine and augmentin. Pt demonstrated no adherence or cost barriers to his medication regimen.   Time spent preparing for discharge counseling: 10 min Time spent counseling patient: 10 min  Synetta Fail, PharmD 09/23/2015, 5:44 PM

## 2015-09-23 NOTE — Progress Notes (Signed)
09/23/2015 10:00 AM  Parker Hansen [id number]  Hosp  Day 3    Temp:  [97.8 F (36.6 C)-97.9 F (36.6 C)] 97.8 F (36.6 C) (01/31 2124) Pulse Rate:  [60-76] 60 (01/31 2124) Resp:  [14-20] 20 (01/31 2124) BP: (126-150)/(66-91) 132/81 mmHg (01/31 2124) SpO2:  [97 %-99 %] 97 % (01/31 2124) Weight:  [86.365 kg (190 lb 6.4 oz)] 86.365 kg (190 lb 6.4 oz) (02/01 0700),     Intake/Output Summary (Last 24 hours) at 09/23/15 1000 Last data filed at 09/22/15 1300  Gross per 24 hour  Intake    358 ml  Output    400 ml  Net    -42 ml    Results for orders placed or performed during the hospital encounter of 09/21/15 (from the past 24 hour(s))  Hemoglobin A1c     Status: None   Collection Time: 09/22/15 10:33 AM  Result Value Ref Range   Hgb A1c MFr Bld 5.5 4.8 - 5.6 %   Mean Plasma Glucose 111 mg/dL  Glucose, capillary     Status: Abnormal   Collection Time: 09/22/15 11:33 AM  Result Value Ref Range   Glucose-Capillary 113 (H) 65 - 99 mg/dL  Glucose, capillary     Status: Abnormal   Collection Time: 09/22/15  4:40 PM  Result Value Ref Range   Glucose-Capillary 102 (H) 65 - 99 mg/dL  Glucose, capillary     Status: Abnormal   Collection Time: 09/22/15  9:33 PM  Result Value Ref Range   Glucose-Capillary 100 (H) 65 - 99 mg/dL  Glucose, capillary     Status: Abnormal   Collection Time: 09/23/15  8:20 AM  Result Value Ref Range   Glucose-Capillary 103 (H) 65 - 99 mg/dL    SUBJECTIVE:  Breathing well.  Notes some epigastric reflux discomfort.  Taking po liquids and solids  OBJECTIVE:  Voice loud and clear. Pharynx with sl residual enlargement RIGHT tonsil  IMPRESSION:  Satisfactory check.  PLAN:  Discharge home on Augmentin.  Recheck my office 1-2 weeks.  Flo Shanks

## 2015-09-26 LAB — CULTURE, BLOOD (ROUTINE X 2)
CULTURE: NO GROWTH
CULTURE: NO GROWTH

## 2022-07-26 ENCOUNTER — Ambulatory Visit (HOSPITAL_COMMUNITY)
Admission: EM | Admit: 2022-07-26 | Discharge: 2022-07-26 | Disposition: A | Discharge status: Home / Self Care | Payer: BC Managed Care – PPO | Attending: Internal Medicine | Admitting: Internal Medicine

## 2022-07-26 ENCOUNTER — Encounter (HOSPITAL_COMMUNITY): Payer: Self-pay

## 2022-07-26 DIAGNOSIS — Z8679 Personal history of other diseases of the circulatory system: Secondary | ICD-10-CM

## 2022-07-26 DIAGNOSIS — R300 Dysuria: Secondary | ICD-10-CM

## 2022-07-26 LAB — COMPREHENSIVE METABOLIC PANEL
ALT: 26 U/L (ref 0–44)
AST: 32 U/L (ref 15–41)
Albumin: 4 g/dL (ref 3.5–5.0)
Alkaline Phosphatase: 64 U/L (ref 38–126)
Anion gap: 9 (ref 5–15)
BUN: 14 mg/dL (ref 8–23)
CO2: 23 mmol/L (ref 22–32)
Calcium: 9 mg/dL (ref 8.9–10.3)
Chloride: 107 mmol/L (ref 98–111)
Creatinine, Ser: 1.08 mg/dL (ref 0.61–1.24)
GFR, Estimated: >60 mL/min (ref >60)
Glucose, Bld: 82 mg/dL (ref 70–99)
Potassium: 3.5 mmol/L (ref 3.5–5.1)
Sodium: 139 mmol/L (ref 135–145)
Total Bilirubin: 1.2 mg/dL (ref 0.3–1.2)
Total Protein: 7.5 g/dL (ref 6.5–8.1)

## 2022-07-26 LAB — POCT URINALYSIS DIPSTICK, ED / UC
Glucose, UA: NEGATIVE mg/dL
Leukocytes,Ua: NEGATIVE
Nitrite: NEGATIVE
Protein, ur: >=300 mg/dL — AB
Specific Gravity, Urine: >=1.03 (ref 1.005–1.030)
Urobilinogen, UA: 1 mg/dL (ref 0.0–1.0)
pH: 5.5 (ref 5.0–8.0)

## 2022-07-26 MED ORDER — AMLODIPINE BESYLATE 5 MG PO TABS: 5.0000 mg | ORAL_TABLET | Freq: Every day | ORAL | 0 refills | Status: AC

## 2022-07-26 MED ORDER — AMLODIPINE BESYLATE 5 MG PO TABS: 5.0000 mg | ORAL_TABLET | Freq: Every day | ORAL | 0 refills | Status: DC

## 2022-07-26 NOTE — ED Provider Notes (Signed)
[facility]    CSN: [id number] Arrival date & time: 07/26/22  4259      History   Chief Complaint Chief Complaint  Patient presents with   Urinary Tract Infection   Hypertension    HPI Parker Hansen is a 71 y.o. male.  Patient presents complaining of dysuria and dark urine that started yesterday.  Patient reports increased urinary frequency.  Patient denies any history of urinary tract infection.  Patient reports 1 male sexual partner currently.  Patient denies any known exposure to an STD.  Patient reports that he only drinks soda and has only drank soda for the past few years.  Patient reports history of a kidney stone occurred once.   Patient reports a history of high blood pressure.  Patient states that he has not taken his blood pressure medications in the past 2 weeks.  Patient reports that he does not follow-up with a primary care doctor.  Patient reports that he has to have his blood pressure checked and verified when he gets his [employer] renewal every year.    Urinary Tract Infection Presenting symptoms: dysuria   Presenting symptoms: no penile discharge and no penile pain   Associated symptoms: urinary frequency   Associated symptoms: no abdominal pain, no fever, no flank pain, no hematuria, no nausea, no penile swelling, no scrotal swelling and no vomiting   Hypertension Pertinent negatives include no abdominal pain.    Past Medical History:  Diagnosis Date   Hypertension     Patient Active Problem List   Diagnosis Date Noted   Sepsis (HCC) 09/21/2015   Hypertension 09/21/2015    History reviewed. No pertinent surgical history.     Home Medications    Prior to Admission medications   Medication Sig Start Date End Date Taking? Authorizing Provider  amLODipine (NORVASC) 5 MG tablet Take 1 tablet (5 mg total) by mouth daily. 07/26/22   Debby Freiberg, NP    Family History Family History  Problem Relation Age of Onset   Hypertension  Other     Social History Social History   Tobacco Use   Smoking status: Never   Smokeless tobacco: Never  Substance Use Topics   Alcohol use: No   Drug use: No     Allergies   Patient has no known allergies.   Review of Systems Review of Systems  Constitutional:  Negative for activity change, appetite change, chills, diaphoresis, fatigue and fever.  Respiratory: Negative.    Cardiovascular: Negative.   Gastrointestinal: Negative.  Negative for abdominal pain, nausea and vomiting.  Endocrine: Negative.   Genitourinary:  Positive for dysuria, frequency and urgency. Negative for decreased urine volume, difficulty urinating, enuresis, flank pain, genital sores, hematuria, penile discharge, penile pain, penile swelling, scrotal swelling and testicular pain.     Physical Exam Triage Vital Signs ED Triage Vitals [07/26/22 0837]  Enc Vitals Group     BP (!) 183/91     Pulse Rate (!) 54     Resp 18     Temp 97.7 F (36.5 C)     Temp Source Oral     SpO2 98 %     Weight      Height      Head Circumference      Peak Flow      Pain Score 0     Pain Loc      Pain Edu?      Excl. in GC?    No data  found.  Updated Vital Signs BP (!) 183/91 (BP Location: Right Arm)   Pulse (!) 54   Temp 97.7 F (36.5 C) (Oral)   Resp 18   SpO2 98%   Physical Exam Vitals and nursing note reviewed. Exam conducted with a chaperone present (CMA leisha).  Constitutional:      Appearance: Normal appearance.  Cardiovascular:     Rate and Rhythm: Normal rate and regular rhythm.     Heart sounds: Normal heart sounds, S1 normal and S2 normal.  Pulmonary:     Effort: Pulmonary effort is normal.     Breath sounds: Normal breath sounds and air entry. No decreased breath sounds, wheezing, rhonchi or rales.  Abdominal:     General: Abdomen is flat. Bowel sounds are normal. There is no distension. There are no signs of injury.     Palpations: Abdomen is soft. There is no shifting dullness,  fluid wave, hepatomegaly, splenomegaly, mass or pulsatile mass.     Tenderness: There is no abdominal tenderness. There is no right CVA tenderness, left CVA tenderness, guarding or rebound. Negative signs include Murphy's sign.  Genitourinary:    Penis: Uncircumcised. No erythema, tenderness, discharge or swelling.      Testes: Normal.     Epididymis:     Right: Normal.     Left: Normal.  Neurological:     Mental Status: He is alert.      UC Treatments / Results  Labs (all labs ordered are listed, but only abnormal results are displayed) Labs Reviewed  POCT URINALYSIS DIPSTICK, ED / UC - Abnormal; Notable for the following components:      Result Value   Bilirubin Urine SMALL (*)    Ketones, ur TRACE (*)    Hgb urine dipstick LARGE (*)    Protein, ur >=300 (*)    All other components within normal limits  URINE CULTURE  COMPREHENSIVE METABOLIC PANEL  CYTOLOGY, (ORAL, ANAL, URETHRAL) ANCILLARY ONLY    EKG   Radiology No results found.  Procedures Procedures (including critical care time)  Medications Ordered in UC Medications - No data to display  Initial Impression / Assessment and Plan / UC Course  I have reviewed the triage vital signs and the nursing notes.  Pertinent labs & imaging results that were available during my care of the patient were reviewed by me and considered in my medical decision making (see chart for details).     Patient was evaluated for dysuria .  Urinalysis showed small bilirubin, trace ketones, large blood, equal to or greater than 300 protein.  Due to patient's symptomology, urine culture was ordered.  Low suspicion of urinary tract infection.  Cytology ordered to rule out potential STD.  Symptoms may be related to patient's current reporting of only drinking soda.  Patient was made aware that he will need to decrease the soda intake significantly if not completely and increase water intake to at least 8 cups of water daily, this could be  the causative problem of symptomology. urinalysis.  Due to high blood pressure and protein seen in urine, CMP ordered to verify kidney function.  Patient has not seen a PCP in years.  Patient was made aware that he will need to schedule an appointment for follow-up and to establish care.  Patient was made aware that he will need to start checking his blood pressure at home and he was educated on appropriate blood pressure readings.  Amlodipine prescription was refilled.   Patient made aware  of timeline for symptom resolution and when follow-up would be necessary.  Patient made aware of results reporting protocol and MyChart.  Patient verbalized understanding of instructions.    Charting was provided using a a verbal dictation system, charting was proofread for errors, errors may occur which could change the meaning of the information charted.   Final Clinical Impressions(s) / UC Diagnoses   Final diagnoses:  Dysuria  History of high blood pressure     Discharge Instructions      We will call you if any of your test results warrant a change in your plan of care.  You may view these test results on MyChart.   It is very important that you start drinking at least 8 cups of water daily.   I have attached information for a primary care doctor to your discharge paperwork, please call and schedule an appointment for follow-up and to establish care.      ED Prescriptions     Medication Sig Dispense Auth. Provider   amLODipine (NORVASC) 5 MG tablet  (Status: Discontinued) Take 1 tablet (5 mg total) by mouth daily. 45 tablet Flossie Dibble, NP   amLODipine (NORVASC) 5 MG tablet Take 1 tablet (5 mg total) by mouth daily. 45 tablet Flossie Dibble, NP      PDMP not reviewed this encounter.   Flossie Dibble, NP 07/26/22 1204

## 2022-07-26 NOTE — Discharge Instructions (Addendum)
We will call you if any of your test results warrant a change in your plan of care.  You may view these test results on MyChart.   It is very important that you start drinking at least 8 cups of water daily.   I have attached information for a primary care doctor to your discharge paperwork, please call and schedule an appointment for follow-up and to establish care.

## 2022-07-26 NOTE — ED Triage Notes (Signed)
Pt c/o burning on urination with urgency/frequency, and urination discoloration.   Pt c/o hypertension d/t out of his meds for 2wks.

## 2022-07-27 LAB — URINE CULTURE
Culture: NO GROWTH
Special Requests: NORMAL

## 2022-07-27 LAB — CYTOLOGY, (ORAL, ANAL, URETHRAL) ANCILLARY ONLY
Chlamydia: NEGATIVE
Comment: NEGATIVE
Comment: NEGATIVE
Comment: NORMAL
Neisseria Gonorrhea: NEGATIVE
Trichomonas: NEGATIVE
